# Patient Record
Sex: Female | Born: 1970
Health system: Western US, Academic
[De-identification: ages and names within clinical notes are randomized; demographics above are authoritative.]

---

## 2019-06-13 ENCOUNTER — Ambulatory Visit: Payer: BLUE CROSS/BLUE SHIELD

## 2019-06-13 DIAGNOSIS — Z3009 Encounter for other general counseling and advice on contraception: Secondary | ICD-10-CM

## 2019-06-13 DIAGNOSIS — N819 Female genital prolapse, unspecified: Secondary | ICD-10-CM

## 2019-06-13 DIAGNOSIS — Z0000 Encounter for general adult medical examination without abnormal findings: Secondary | ICD-10-CM

## 2019-06-13 DIAGNOSIS — Z30431 Encounter for routine checking of intrauterine contraceptive device: Secondary | ICD-10-CM

## 2019-06-13 MED ORDER — IBUPROFEN 600 MG PO TABS
600 mg | ORAL_TABLET | Freq: Four times a day (QID) | ORAL | 0 refills | Status: AC | PRN
Start: 2019-06-13 — End: ?

## 2019-06-13 MED ORDER — HYDROCODONE-ACETAMINOPHEN 5-325 MG PO TABS
1 | ORAL_TABLET | Freq: Four times a day (QID) | ORAL | 0 refills | 4.00000 days | Status: AC | PRN
Start: 2019-06-13 — End: ?

## 2019-06-13 NOTE — H&P
GYNECOLOGY OUTPATIENT NOTE    PATIENT:  Sharon Franco  MRN:  [id number]  DOB:  1971-07-19  DATE OF SERVICE:  06/13/2019  PRIMARY CARE PROVIDER: Pcp, No, MD     CC: new patient, annual well woman exam    HPI: 48 y.o. [id number] premenopausal woman    Here for annual exam.    She has several complaints today:    1. Paragard IUD - placed in 2018 after vaginal delivery. Feels like it's contributing to heavier menses and menstrual cramping. Likes the idea of a LARC, but wondering about other options    2. Vaginal bulge - noticed after vaginal delivery in 2018. Not painful, but bothersome. Wondering if she should do Kegel exercises. Denies pelvic floor pain, abnormal bleeding/discharge, dysuria, hematuria, or urinary incontinence    3. Due for routine pap smear, breast exam, and mammogram. It's been several years since she had a well woman exam    No other gynecologic complaints.    Denies fevers, chills, hot flashes, irregular menstrual cycles, vaginal dryness, or GI/urinary symptoms.    PAST MEDICAL HISTORY:  Past Medical History:   Diagnosis Date   ??? Hypothyroidism    ??? IUD (intrauterine device) in place 2018    Paragard IUD inserted 2018       PAST SURGICAL HISTORY:  Past Surgical History:   Procedure Laterality Date   ??? DILATION AND CURETTAGE OF UTERUS      x3   ??? DILATION AND EVACUATION  04/20/2012    Procedure: DILATION AND EVACUATION;  Surgeon: Addison Lank., MD;  Location: [facility];  Service: OB GYN Surgery;  Laterality: N/A;  DILATION AND EVACUATION,PELVIC EXAM UNDER ANESTHESIA, IUD Placement.   ??? SEPTOPLASTY         OB/GYN HISTORY:  OB History   Gravida Para Term Preterm AB Living   6 1 1   5 1    SAB TAB Ectopic Multiple Live Births   1 4     1       # Outcome Date GA Lbr Len/2nd Weight Sex Delivery Anes PTL Lv   6 Term 2018 [redacted]w[redacted]d  7 lb 8 oz (3.402 kg) F Vag-Spont   LIV   5 TAB 2013 [redacted]w[redacted]d    TAB         Birth Comments: Fetal di George syndrome   4 SAB      SAB      3 TAB      TAB         Birth Comments: Fetal T21 2 TAB      TAB      1 TAB      TAB         Obstetric Comments   Normal monthly menses, lasting 5 days   No h/o STIs or PID   No h/o abnormal pap tests   Last pap (06/13/19)   BCM: Paragard IUD inserted 2018 about 2 months postpartum       MEDICATIONS:    Current Outpatient Medications:   ???  copper IUD, 1 each by Intrauterine route., Disp: , Rfl:   ???  thyroid (ARMOUR) 130 mg tablet, Take 35 mg by mouth daily., Disp: , Rfl:      ALLERGIES:  Patient has no known allergies.     FAMILY HISTORY:  Family History   Problem Relation Age of Onset   ??? Anesthesia problems Neg Hx    ??? Malignant hypertension Neg Hx    ??? Hypotension Neg Hx    ???  Malignant hyperthermia Neg Hx    ??? Pseudochol deficiency Neg Hx    ??? Breast cancer Neg Hx    ??? Ovarian cancer Neg Hx    ??? Uterine cancer Neg Hx    ??? Colon cancer Neg Hx        SOCIAL HISTORY:  Social History     Socioeconomic History   ??? Marital status: Single     Spouse name: Not on file   ??? Number of children: Not on file   ??? Years of education: Not on file   ??? Highest education level: Not on file   Occupational History   ??? Not on file   Social Needs   ??? Financial resource strain: Not on file   ??? Food insecurity:     Worry: Not on file     Inability: Not on file   ??? Transportation needs:     Medical: Not on file     Non-medical: Not on file   Tobacco Use   ??? Smoking status: Never Smoker   ??? Smokeless tobacco: Never Used   Substance and Sexual Activity   ??? Alcohol use: No   ??? Drug use: No   ??? Sexual activity: Not on file   Lifestyle   ??? Physical activity:     Days per week: Not on file     Minutes per session: Not on file   ??? Stress: Not on file   Relationships   ??? Social connections:     Talks on phone: Not on file     Gets together: Not on file     Attends religious service: Not on file     Active member of club or organization: Not on file     Attends meetings of clubs or organizations: Not on file     Relationship status: Not on file   Other Topics Concern   ??? Not on file Social History Narrative   ??? Not on file        REVIEW OF SYSTEMS:  14 point review of system negative, unless mentioned in the HPI above.    PHYSICAL EXAM:  VS: BP 121/77 (BP Location: Left arm, Patient Position: Sitting, Cuff Size: Regular)  ~ Pulse 83  ~ Temp 36.5 ???C (97.7 ???F) (Forehead)  ~ Ht 5' 1'' (1.549 m)  ~ Wt 133 lb 3.2 oz (60.4 kg)  ~ BMI 25.17 kg/m???     General Appearance: alert, well appearing and in no distress  Breasts: no masses noted, nontender, no palpable axillary nodes, no skin changes  Abdomen: soft, nontender, nondistended  Pelvic Exam:             Vulva: normal appearing, no lesions or masses            Vagina: anterior vaginal wall prolapse to about 1cm proximal to hymen with valsalva, pink mucosa, normal rugae, no lesions            Cervix: parous, no lesions, non-tender. IUD strings visualized            Uterus: normal size and contour, mobile, non-tender            Adnexae: no palpable masses, non-tender  Extremities: warm and well-perfused, no LE edema or calf erythema/TTP  Skin: normal coloration and turgor, no rashes  Neurological: A&O x3, normal mood and affect    A certified chaperone was present for all sensitive exams.     LABS:    IMAGING:  ASSESSMENT:   48 y.o. presents for:     1. Encounter for screening and preventative care    2. IUD check up    3. Encounter for other general counseling or advice on contraception    4. Female genital prolapse, unspecified type        PLAN:  1. Annual well woman exam  Cervical cancer screening: pap with HPV co-testing done today  STI screening: declined  Breast cancer screening: CBE WNL today, MMG ordered  Colon cancer screening: colonoscopy at age 80  Osteoporosis screening: DXA at age 45  Contraception: Paragard IUD. Strings seen on exam today    2. Contraception counseling  We discussed all options for contraception including barrier methods, Nuvaring, OCPs, transdermal patch, DMPA, IUD, and Nexplanon. We discussed typical side effects/serious adverse effects    Patient considering Paragard IUD removal, and Mirena IUD placement due to unwanted side effects from Paragard (heavier menses, more cramping)    Will return for IUD removal/replacement if she decides to proceed    Per patient, last IUD procedure was very painful. Requested analgesics. Sent Rx ibuprofen and Norco to take as needed after she returns for IUD removal/replacement. She will have her husband drive her to/from the procedure appointment    3. 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 Briefly discussed possible option of pessary or surgery if she fails other treatments      Orders Placed This Encounter   ??? HPV DNA PCR,Genital   ??? Mammogram, screening, bilat breast   ??? Referral to Rehabilitation, Physical Therapy   ??? Pap smear 30+ yo (with HPV), [facility]   ??? Pap Smear   ??? copper IUD   ??? HYDROcodone-acetaminophen 5-325 mg tablet   ??? ibuprofen 600 mg tablet       Return for IUD removal and replacement.     She has our contact information for any questions or concerns.    She asked appropriate questions, these were answered to her satisfaction.    For any testing performed, we will contact her with abnormal results in 1-2 weeks. She was given instructions to call with any questions or concerns sooner or if she does not hear from [place] in the time frame discussed.      Future Appointments   Date Time Provider Department Center   06/29/2019 10:00 AM Hipolito Bayley., MD OBG [facility] [address] [facility] [place]       Adolm Joseph. Cassell Clement, MD

## 2019-06-15 ENCOUNTER — Ambulatory Visit: Payer: BLUE CROSS/BLUE SHIELD

## 2019-06-29 ENCOUNTER — Ambulatory Visit: Payer: BLUE CROSS/BLUE SHIELD

## 2019-06-29 DIAGNOSIS — Z0000 Encounter for general adult medical examination without abnormal findings: Secondary | ICD-10-CM

## 2019-06-29 DIAGNOSIS — Z30433 Encounter for removal and reinsertion of intrauterine contraceptive device: Secondary | ICD-10-CM

## 2019-06-29 MED ADMIN — LEVONORGESTREL 20 MCG/24HR IU IUD: 1 | INTRAUTERINE | @ 18:00:00 | Stop: 2019-06-29 | NDC 50419042301

## 2019-06-30 LAB — Trichomonas vaginalis Antigen: TRICHOMONAS VAGINALIS ANTIGEN: NEGATIVE

## 2019-07-12 NOTE — Progress Notes
GYNECOLOGY OUTPATIENT NOTE    PATIENT:  Rokhaya Quinn  MRN:  [id number]  DOB:  1970/12/25  DATE OF SERVICE:  07/12/2019  PRIMARY CARE PROVIDER: Pcp, No, MD     CC: contraception management, IUD removal and reinsertion    HPI: 48 y.o. [id number] premenopausal woman.     Last seen by me on 06/13/19 for annual well woman exam.     Here today for contraception management. Desires Paragard IUD removal due to unwanted side effects (heavier menses and menstrual cramping). Considering a different LARC, likely Mirena IUD.     Endorses thin white vaginal discharge. Denies fever, chills, abdominal/pelvic pain, vulvar itching/pain, or vaginal malodor.    PAST MEDICAL HISTORY:  Past Medical History:   Diagnosis Date   ? Hypothyroidism    ? IUD (intrauterine device) in place 2018    Paragard IUD inserted 2018       PAST SURGICAL HISTORY:  Past Surgical History:   Procedure Laterality Date   ? DILATION AND CURETTAGE OF UTERUS      x3   ? DILATION AND EVACUATION  04/20/2012    Procedure: DILATION AND EVACUATION;  Surgeon: Addison Lank., MD;  Location: [facility];  Service: OB GYN Surgery;  Laterality: N/A;  DILATION AND EVACUATION,PELVIC EXAM UNDER ANESTHESIA, IUD Placement.   ? SEPTOPLASTY         OB/GYN HISTORY:  OB History   Gravida Para Term Preterm AB Living   6 1 1   5 1    SAB TAB Ectopic Multiple Live Births   1 4     1       # Outcome Date GA Lbr Len/2nd Weight Sex Delivery Anes PTL Lv   6 Term 2018 [redacted]w[redacted]d  7 lb 8 oz (3.402 kg) F Vag-Spont   LIV   5 TAB 2013 [redacted]w[redacted]d    TAB         Birth Comments: Fetal di George syndrome   4 SAB      SAB      3 TAB      TAB         Birth Comments: Fetal T21   2 TAB      TAB      1 TAB      TAB         Obstetric Comments   Normal monthly menses, lasting 5 days   No h/o STIs or PID   No h/o abnormal pap tests   Last pap (06/13/19): NILM, HPV neg   BCM: Paragard IUD inserted 2018        MEDICATIONS:    Current Outpatient Medications:   ?  copper IUD, 1 each by Intrauterine route., Disp: , Rfl: ?  HYDROcodone-acetaminophen 5-325 mg tablet, Take 1 tablet by mouth every six (6) hours as needed for Severe Pain (Pain Scale 7-10). Max Daily Amount: 4 tablets, Disp: 5 tablet, Rfl: 0  ?  ibuprofen 600 mg tablet, Take 1 tablet (600 mg total) by mouth every six (6) hours as needed for Mild Pain (Pain Scale 1-3) or Moderate Pain (Pain Scale 4-6). (Patient not taking: Reported on 06/29/2019.), Disp: 20 tablet, Rfl: 0  ?  thyroid (ARMOUR) 130 mg tablet, Take 35 mg by mouth daily., Disp: , Rfl:      ALLERGIES:  Patient has no known allergies.      REVIEW OF SYSTEMS:   14 point review of system negative, unless mentioned in the HPI above.     PHYSICAL  EXAM:  VS: Ht 5' 1'' (1.549 m)  ~ Wt 135 lb 3.2 oz (61.3 kg)  ~ BMI 25.55 kg/m?     General Appearance: alert, well appearing and in no distress  Pelvic Exam: see full exam from 06/13/19  Extremities: warm and well-perfused, no LE edema or calf erythema/TTP  Skin: normal coloration and turgor, no rashes  Neurological: A&O x3, normal mood and affect    A certified chaperone was present for all sensitive exams.     LABS:    IMAGING:    ASSESSMENT:   48 y.o. presents for:     1. Encounter for removal and reinsertion of intrauterine contraceptive device (IUD)    2. Encounter for screening and preventative care        PLAN:  1. Contraception management - IUD removal/insertion  We discussed all options for contraception, including barrier methods, Nuvaring, OCPs, transdermal patch, DMPA, IUD (LNG or copper), and Nexplanon.  We discussed typical side effects/serious adverse effects from all of the above methods.    Patient desires Paragard IUD removal today due to unwanted side effects (heavy menses, cramping)    She would like to proceed with Mirena IUD insertion. We discussed the main side effects of 80% chance lighter periods, irregular spotting for the first 6-8 months, 20% chance of amenorrhea. We also discussed the small chance of acne and ovarian cyst formation. These are usually transient. The intrauterine device does not protect against STIs and does not elevate the risk of PID beyond any intrauterine procedure. Beyond side effects noted above, we discussed small risk of perforation, infection, expulsion. We discussed possible discomfort during placement in detail and she verbalized understanding of all this. She asked appropriate questions, these were answered to her satisfaction. She desired to proceed    IUD Removal and Insertion Procedure Note  Risks/benefits/alternatives reviewed. After informed consent was obtained the patient was placed in the lithotomy position. Bimanual exam was performed and this revealed a uterus was 8 weeks and anteverted. Cervix was visualized with a speculum and a specimen was collected for GC/CT/wet mount testing. The IUD strings were then grasped with ring forceps and the Paragard IUD was removed intact without difficulty. Next, the cervix was cleansed with betadine and a tenaculum was placed on the anterior lip of the cervix. The uterus was sounded to 7.5 cm. Mirena device placed in one pass. Strings cut to 2 cm. Tenaculum was removed. Hemostasis was assured. The patient tolerated procedure well. She'll follow-up in 3 months sooner if any questions/concerns. She has our contact information.    A certified chaperone was present for the entire procedure.     2. Pelvic organ prolapse  Discuss exam findings on 06/13/19, appears to be Stage II POP at this time  Reviewed that this is a benign condition  Continue Kegel exercises  If no improvement with Kegel's, then will try pelvic floor physical therapy  Will refer to [facility] if no improvement with Kegels/PFPT. Briefly discussed possible option of pessary or surgery if she fails other treatments    3. Healthcare maintenance   Cervical cancer screening: pap UTD, next due 06/2024  STI screening: GC/CT collected today. Declined further testing Breast cancer screening: CBE WNL 06/13/19, MMG scheduled 09/17/19  Colon cancer screening: colonoscopy at age 60  Osteoporosis screening: DXA at age 73  Contraception: Mirena IUD as above    Orders Placed This Encounter   ? Bacterial Vaginosis Screen   ? Trichomonas vaginalis Antigen, Genital Swab   ?  Fungal Stain, Genital Swab   ? Chlamydia trachomatis/Neisseria gonorrhoeae PCR, Genital   ? levonorgestrel (Mirena) 20 mcg/24 hr IUD 1 intra uterine device     Return in about 3 months (around 09/29/2019) for IUD check. Sooner as needed.     She has our contact information for any questions or concerns.    She asked appropriate questions, these were answered to her satisfaction.    For any testing performed, we will contact her with abnormal results in 1-2 weeks. She was given instructions to call with any questions or concerns sooner or if she does not hear from [place] in the time frame discussed.    More than 50% of the visit was spent in counseling and coordination of care. Total encounter time: 15 minutes.    Future Appointments   Date Time Provider Department Center   09/17/2019  8:30 AM [facility] 01-RADIOLOGY [facility]       Adolm Joseph. Cassell Clement, MD

## 2019-09-17 ENCOUNTER — Ambulatory Visit: Payer: BLUE CROSS/BLUE SHIELD

## 2019-10-17 ENCOUNTER — Telehealth: Payer: BLUE CROSS/BLUE SHIELD

## 2019-10-17 NOTE — Telephone Encounter
Will fax the prescription to number below.

## 2019-10-17 NOTE — Telephone Encounter
Orders Request    What is being requested? (Tests, Labs, Imaging, etc.): Mammogram- Imaging     Reason for the request: The patient moved to [facility] and would like to complete her mammogram at  [facility]     Where does the patient want to be seen?  [facility]      If outside [place], what is the fax number to the facility?  [phone]    Has the patient seen their doctor for this matter? Yes     Last office visit: 06/13/19    Patient was offered an appointment but declined.    Patient has been notified of the 24-48 hour turnaround time.

## 2019-10-18 NOTE — Telephone Encounter
Called pt and left her know.

## 2019-12-09 ENCOUNTER — Ambulatory Visit: Payer: BLUE CROSS/BLUE SHIELD

## 2019-12-09 DIAGNOSIS — Z23 Encounter for immunization: Secondary | ICD-10-CM

## 2021-04-03 IMAGING — MG MAMMOGRAPHY SCREENING BILATERAL 3[PERSON_NAME]
8 series · 8 of 24 positions shown · non-contrast
Comparison: None

ADDENDED Diagnostic Imaging Report 
________________________________________________________________________________________________ 
******** ADDENDUM #1 ********/n 
Prior exams are now available for direct comparison. 
There is increased density identified directly deep to the areola all the way 
along the chest wall. Not seen on the comparison exams. Recommend additional 
imaging with possible sonography. 
BI-RADS 0. 
MAMMOGRAPHY SCREENING BILATERAL 3MAXX MARCONI, 04/03/2021 [DATE]: 
CLINICAL INDICATION: Screening.
TECHNIQUE: Digital bilateral mammograms and 3-D Tomosynthesis were obtained. 
These were interpreted both primarily and with the aid of computer-aided 
detection system.  
BREAST DENSITY: (Level D) The breasts are extremely dense, which lowers the 
sensitivity of mammography.

[L MLO]
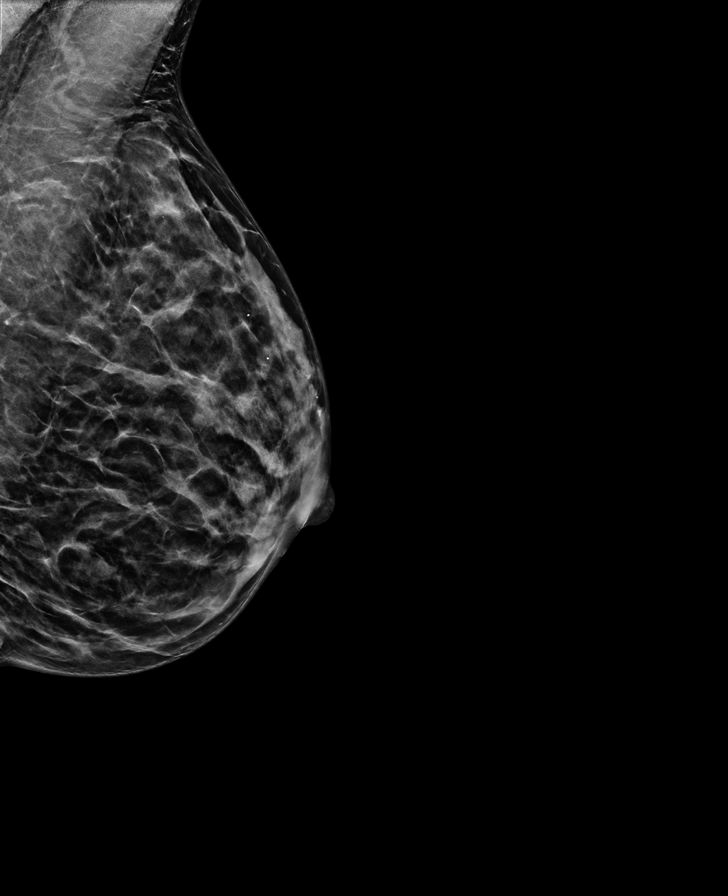

[R CC]
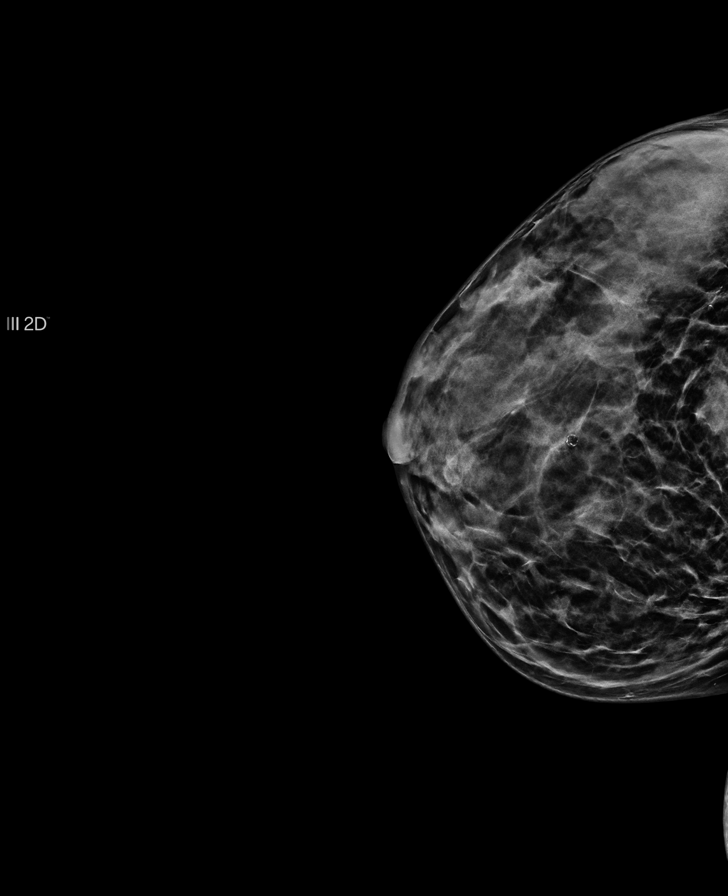

[L CC]
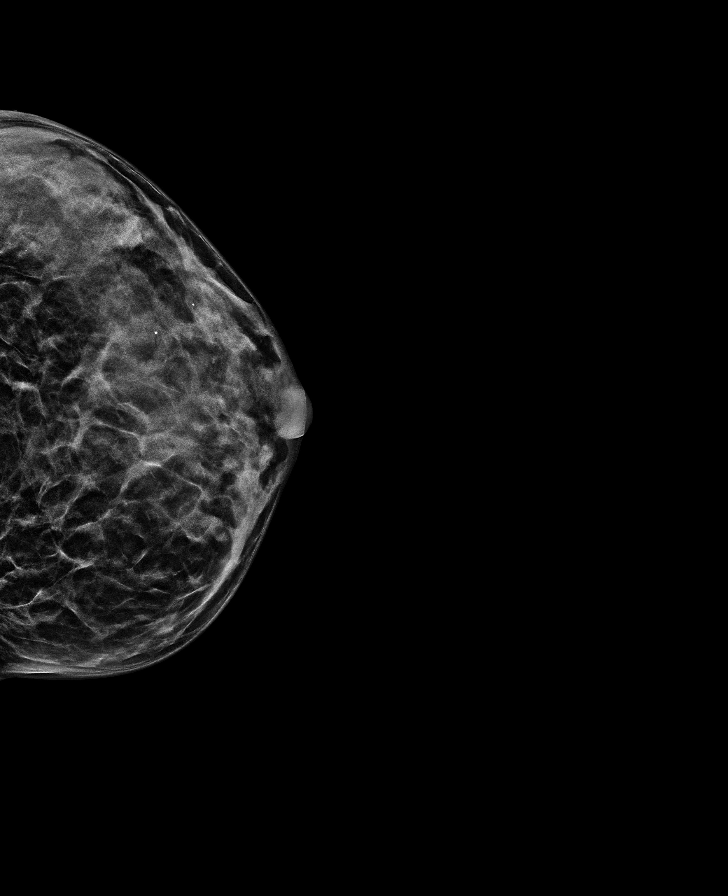

[R MLO]
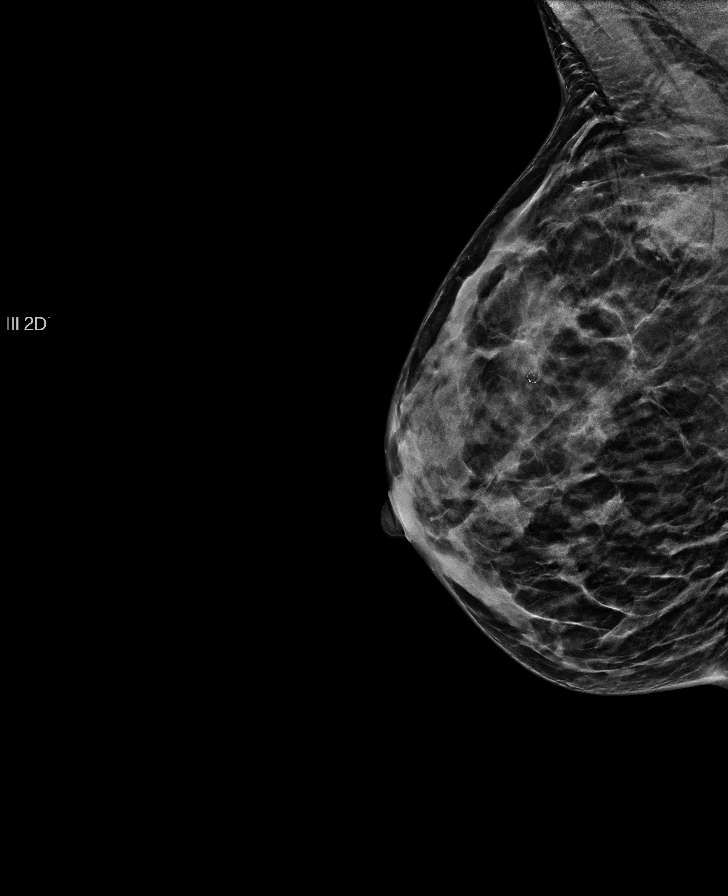

[R CC tomo · tomo slice 21/40.0]
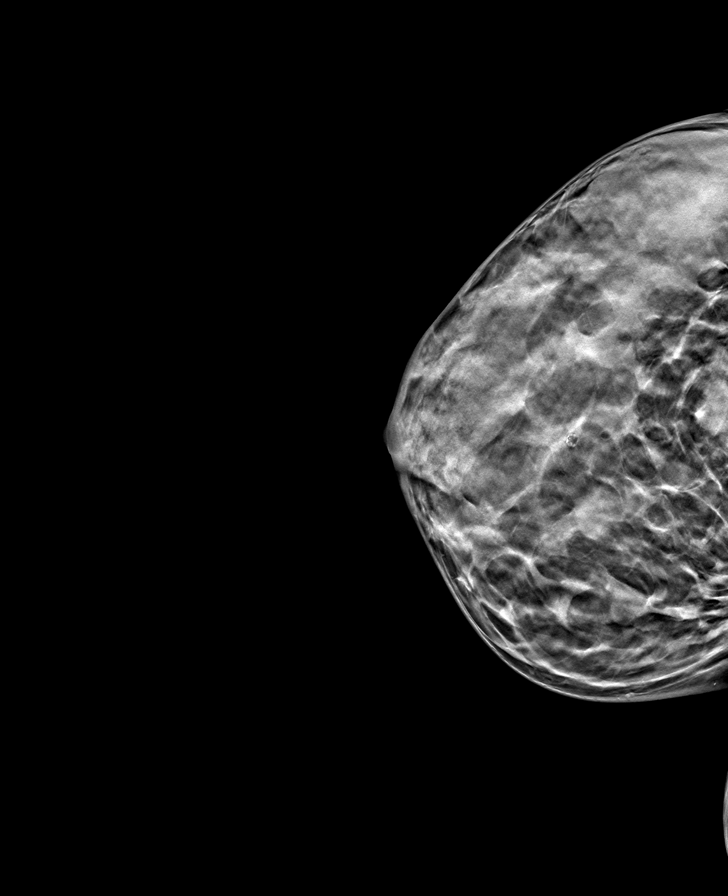

[L MLO tomo · tomo slice 20/39.0]
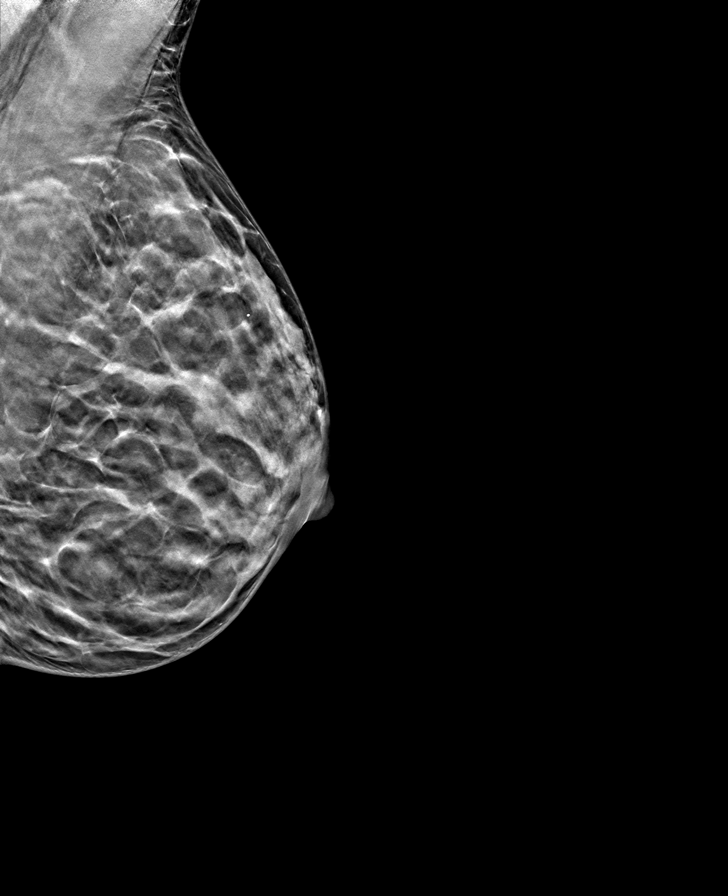

[R MLO tomo · tomo slice 19/38.0]
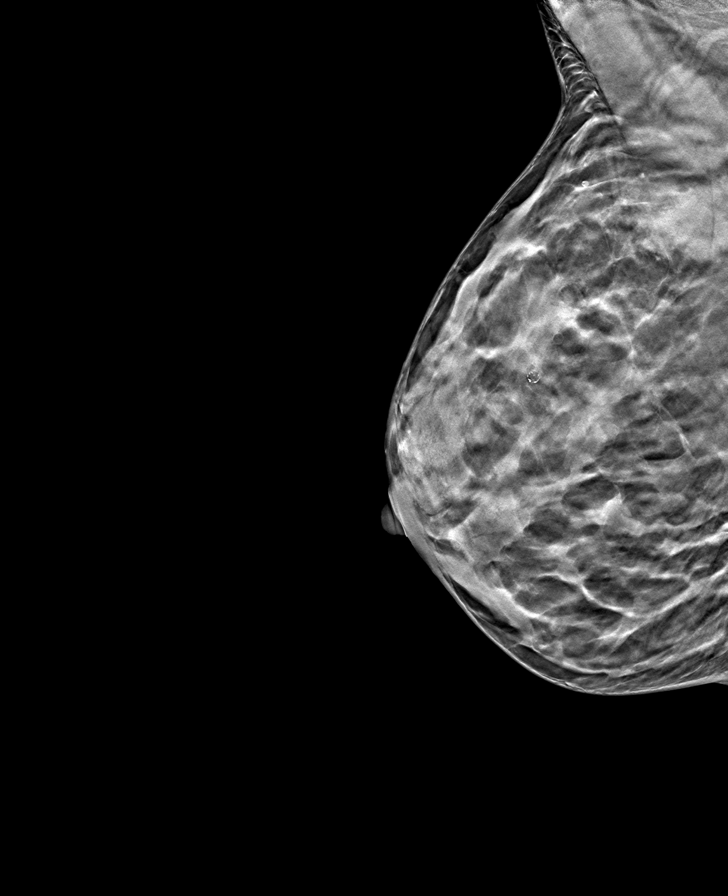

[L CC tomo · tomo slice 21/40.0]
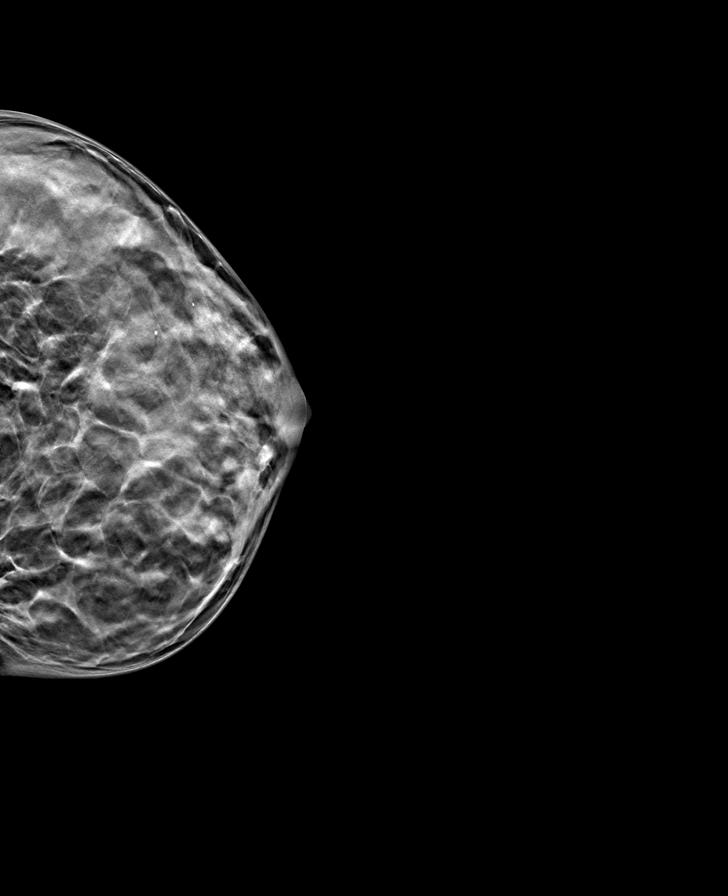

[8 of 24 positions shown; findings below may reference images not displayed]

FINDINGS: Dense glandular tissue. Few scattered benign calcifications. Increased 
density seen deep to the areola within the right cc view back along the chest 
wall. May represent glandular tissue seen superiorly on the MLO view. Will 
attempt to obtain the patients prior films for direct comparison.
IMPRESSION: Comparison exam with priors recommended. 
(BI-RADS 0) Incomplete. Further evaluation will be performed as discussed above 
and the results will be reported separately.

## 2021-04-26 IMAGING — MG MAMMOGRAPHY DIAGNOSTIC RIGHT 3D TOMOSYNT
6 series · 6 of 18 positions shown · non-contrast
Comparison: 04/03/2021 and dating back to 05/10/2014.

________________________________________________________________________________________________ 
MAMMOGRAPHY DIAGNOSTIC RIGHT 3D TOMOSYNT, 04/26/2021 [DATE]: 
CLINICAL INDICATION: Callback exam.
TECHNIQUE: Unilateral right breast digital mammography and 3-D Tomosynthesis 
were obtained. In addition, computer-aided detection was utilized. 
BREAST DENSITY: (Level D) The breasts are extremely dense, which lowers the 
sensitivity of mammography.

[R ML]
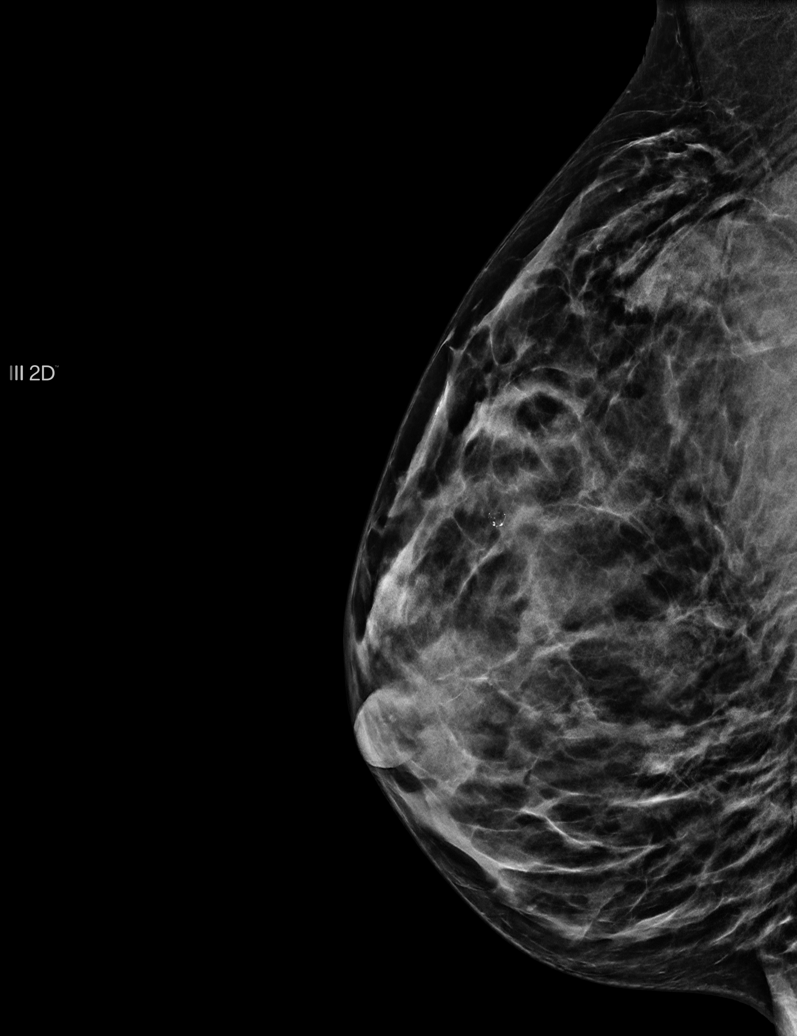

[R CC (1 of 2)]
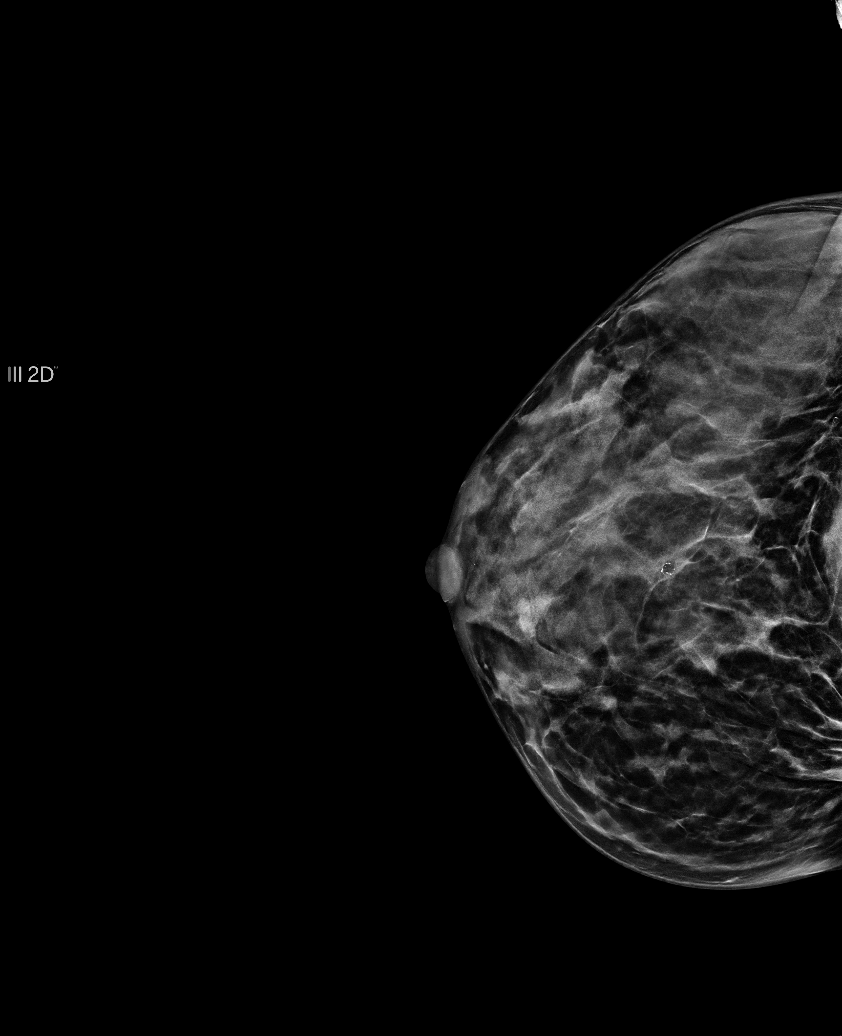

[R CC (2 of 2)]
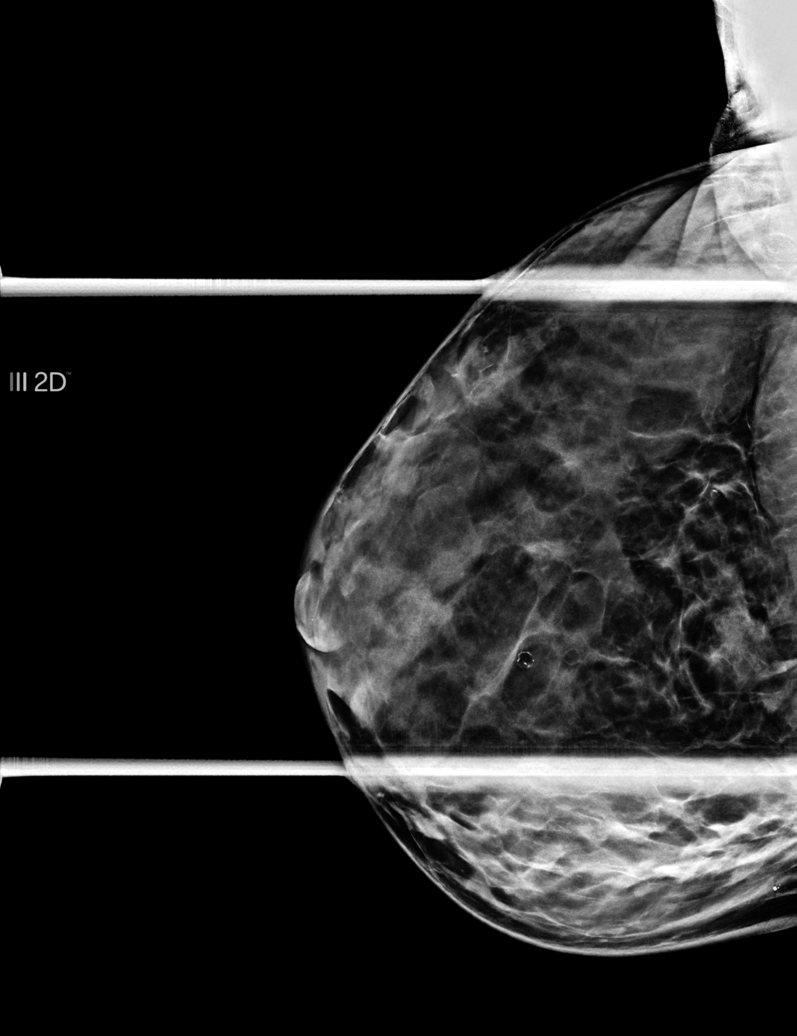

[R CC tomo (1 of 2) · tomo slice 23/44.0]
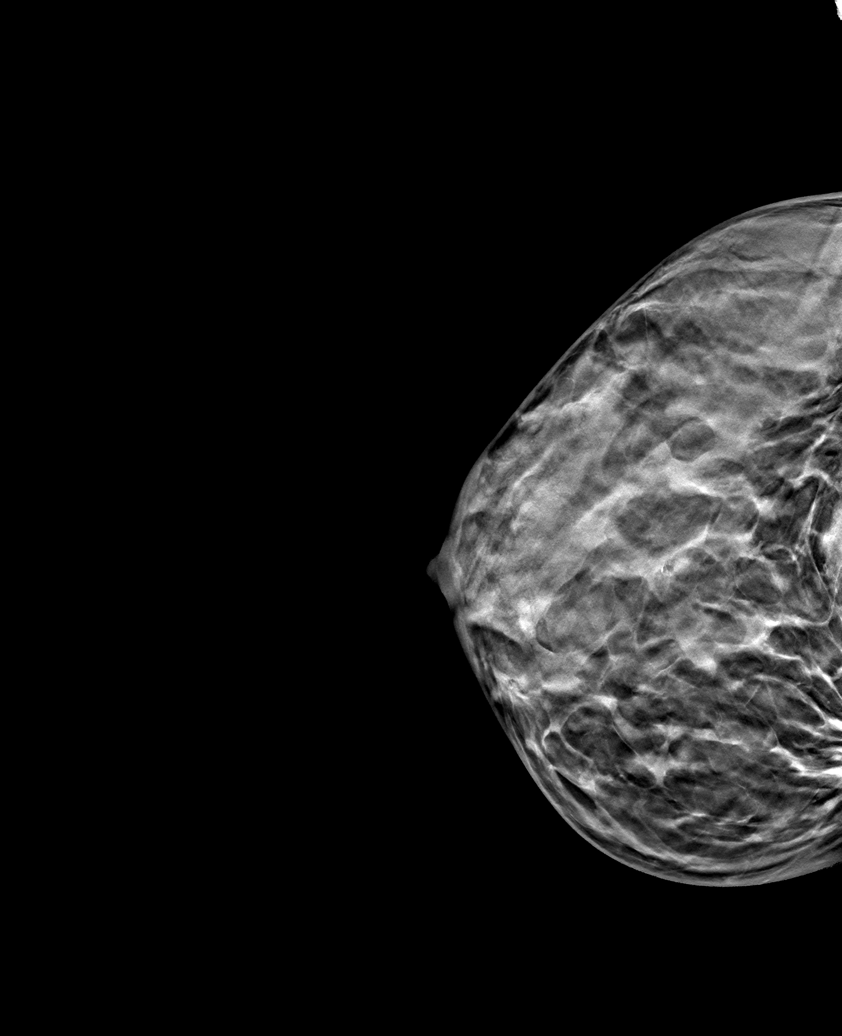

[R ML tomo · tomo slice 22/43.0]
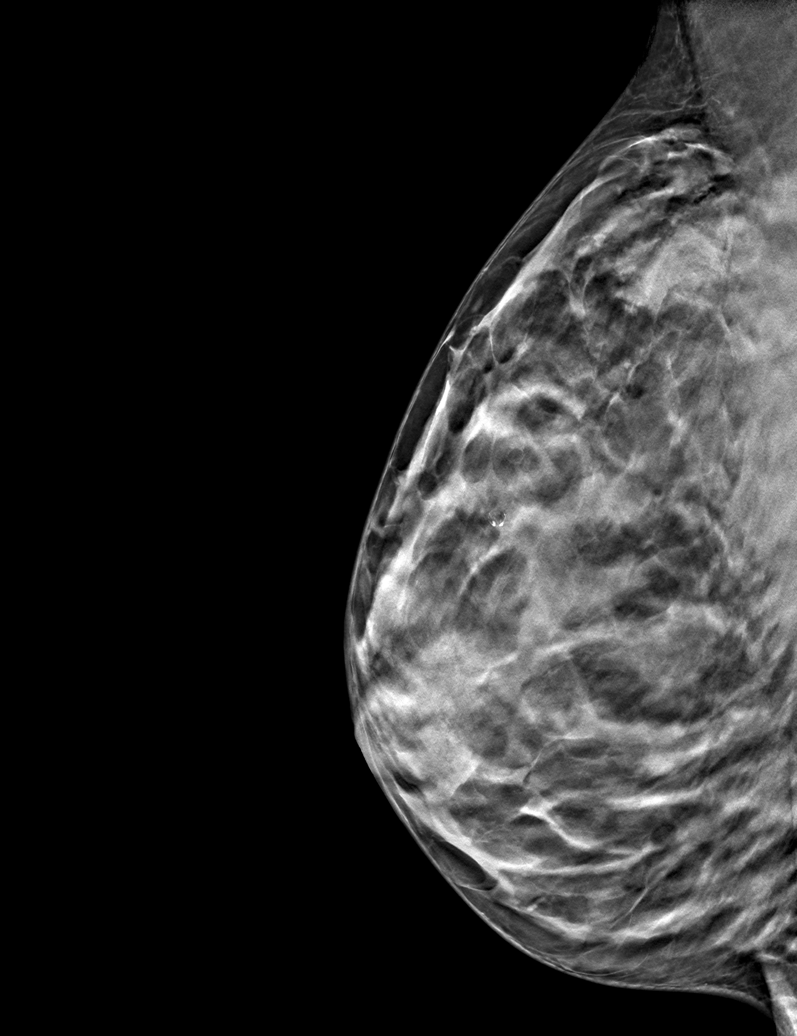

[R CC tomo (2 of 2) · tomo slice 25/48.0]
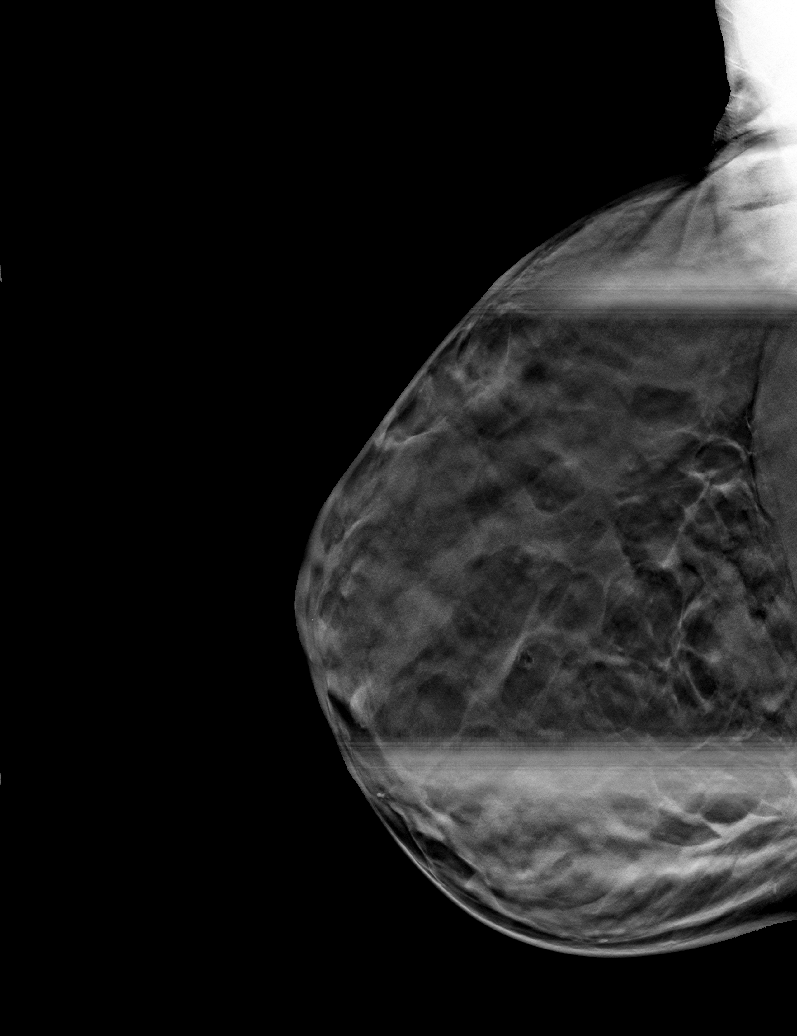

[6 of 18 positions shown; findings below may reference images not displayed]

FINDINGS: The area in question compresses as normal fibroglandular parenchyma 
on todays exam. No suspicious mass, calcifications, or area of architectural 
distortion in the right breast.
IMPRESSION: Overall stable mammographic appearance of the right breast. 
(BI-RADS 2) Benign findings. Routine mammographic follow-up is recommended.

## 2021-05-31 IMAGING — MR MRI BREAST BILATERAL W/WO CONTRAST
4 of 12 series · 13 of 48 positions shown · IV contrast (gadolinium)
Comparison: Mammogram of 04/26/2021 and dating back to 05/10/2014.

________________________________________________________________________________________________ 
MRI BREAST BILATERAL W/WO CONTRAST, 05/31/2021 [DATE]: 
CLINICAL INDICATION: Diffuse cystic mastopathy. Dense breasts. Patient feels 
lumps. Breast swelling. Dimpling.
TECHNIQUE: Multiple sequences were obtained in various planes with both before 
and after the intravenous administration of gadolinium. In addition to the 
routine images, three-dimensional renderings were performed on an independent 
workstation, time activity curves generated over areas of enhancement and 
computer-aided detection utilized. 7.5 mL of Gadavist were injected 
intravenously with the injector at a rate of 1.5 cc per second. Patient was 
scanned on a 3 to magnet.

[Series 201: survey · axial · 7.0mm · 1.34mm/px · z∈[-1,+257]mm · 2 of 25 slices shown]
[im 1/25]
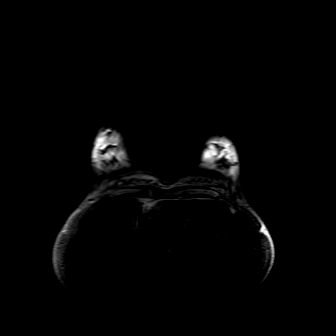
[im 25/25]
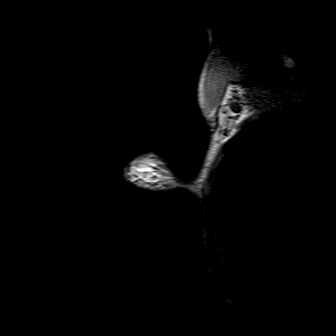

[Series 301: t1w_ffe_3d · axial · 1.6mm · 0.43mm/px · z∈[-21,+178]mm · 4 of 205 slices shown]
[im 1/205]
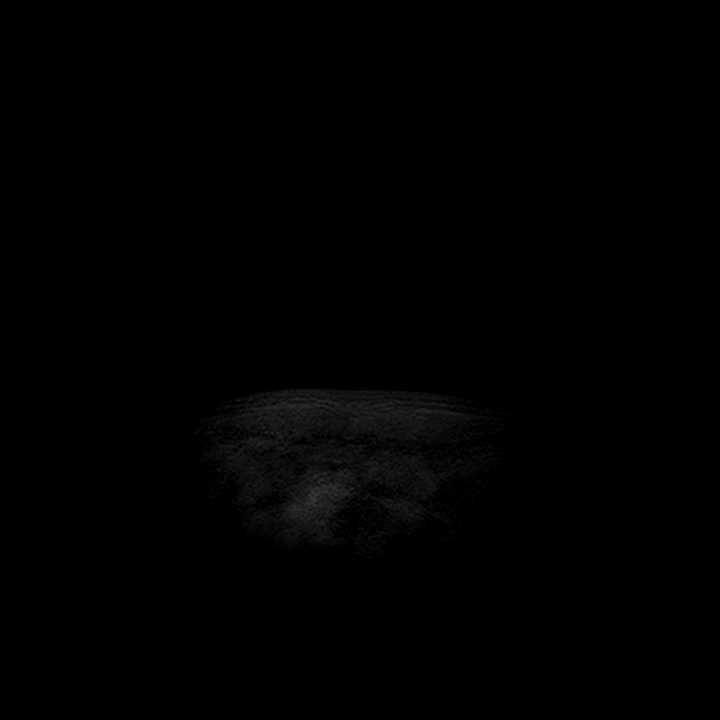
[im 69/205]
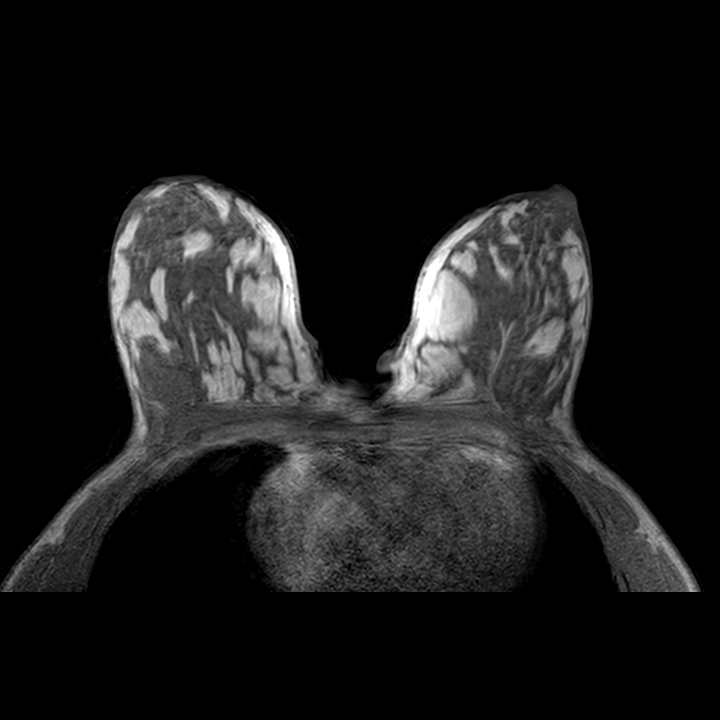
[im 137/205]
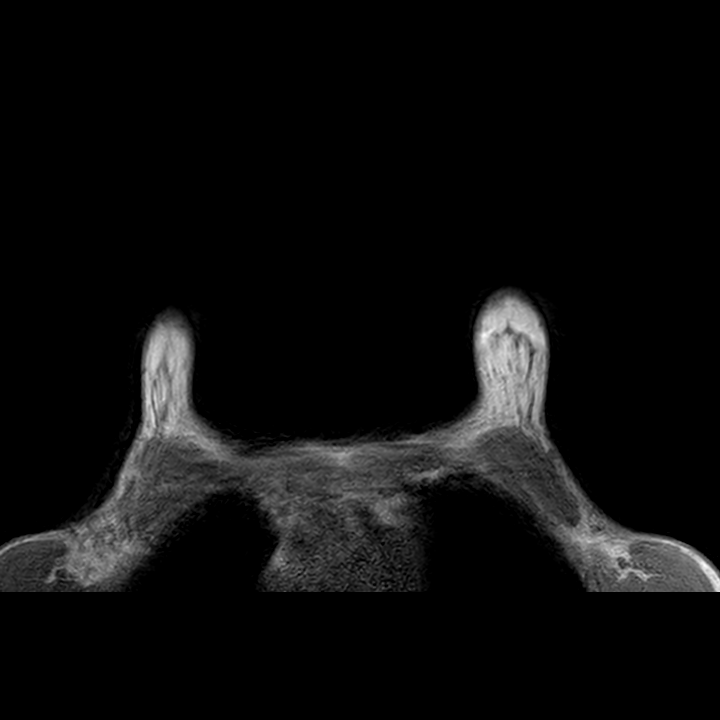
[im 205/205]
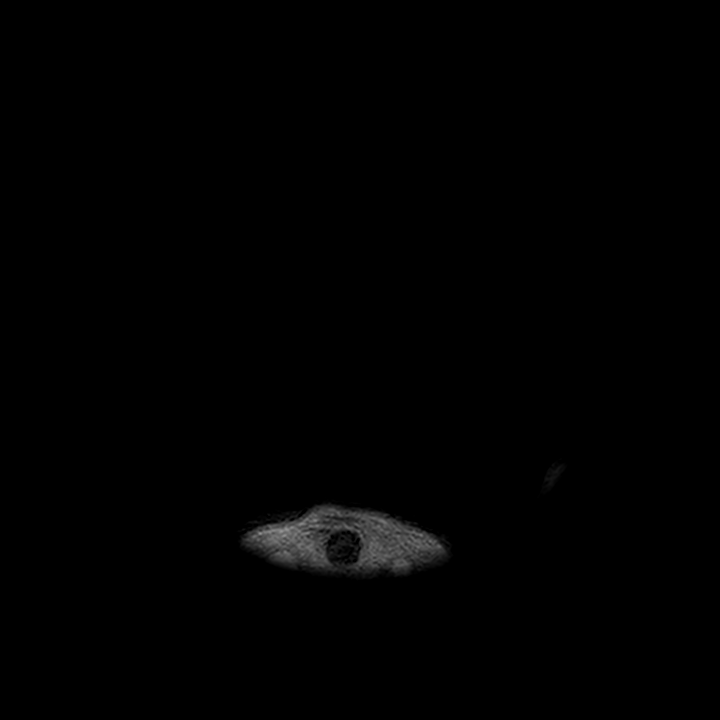

[Series 401: t2w_(id) · axial · 2.5mm · 0.49mm/px · z∈[-20,+177]mm · 2 of 80 slices shown]
[im 1/80]
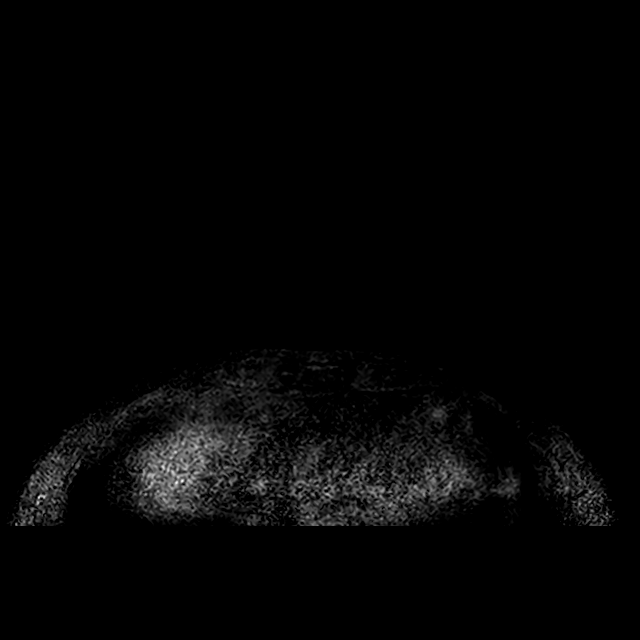
[im 80/80]
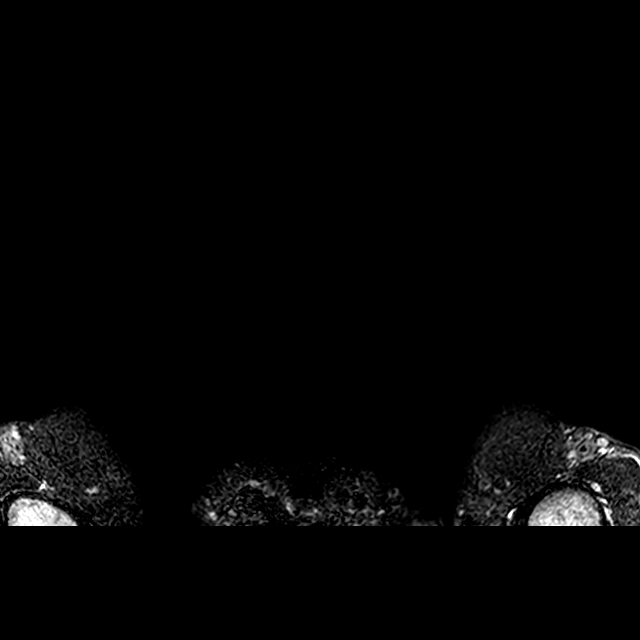

[Series 502: ssub dyn 1 · axial · 1.6mm · 0.46mm/px · z∈[-21,+178]mm · 5 of 250 slices shown]
[im 1/250]
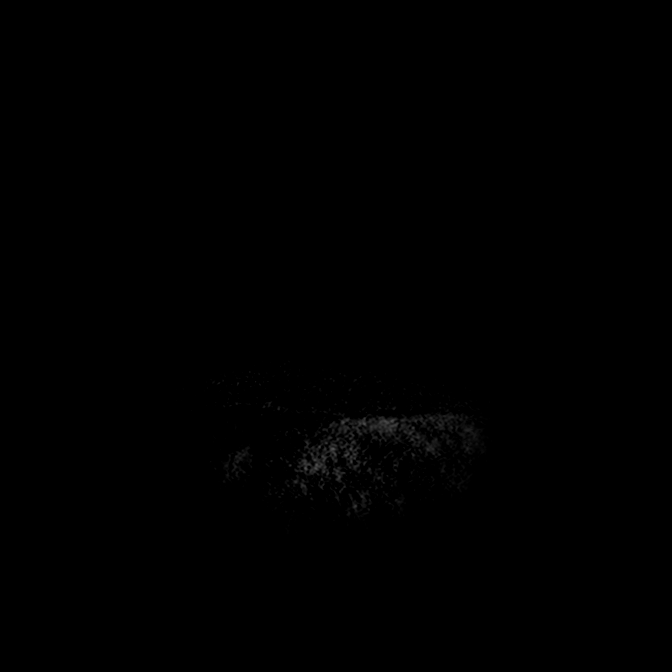
[im 63/250]
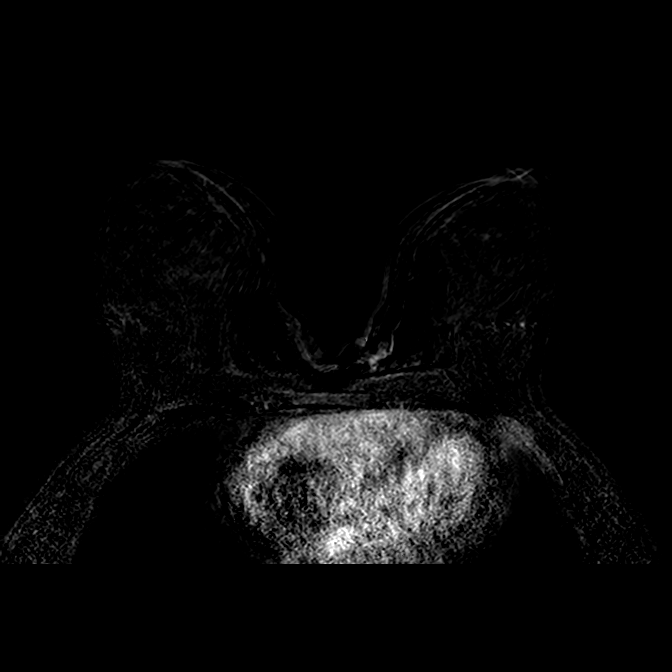
[im 125/250]
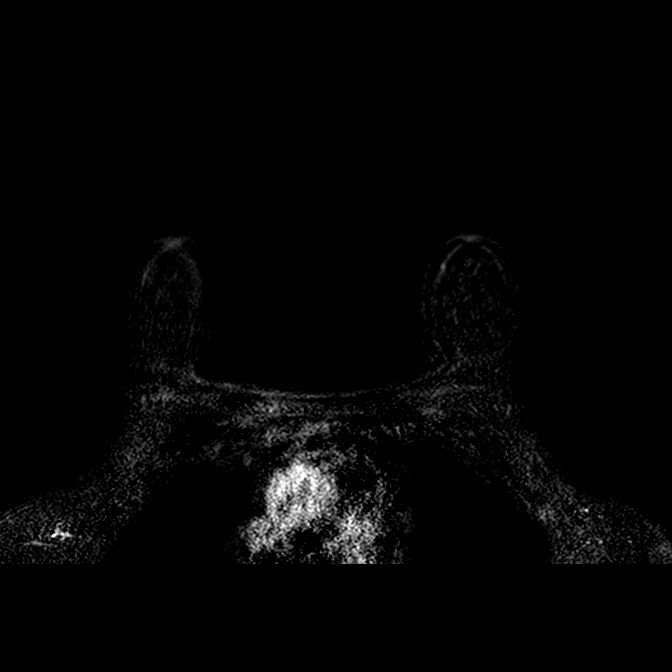
[im 187/250]
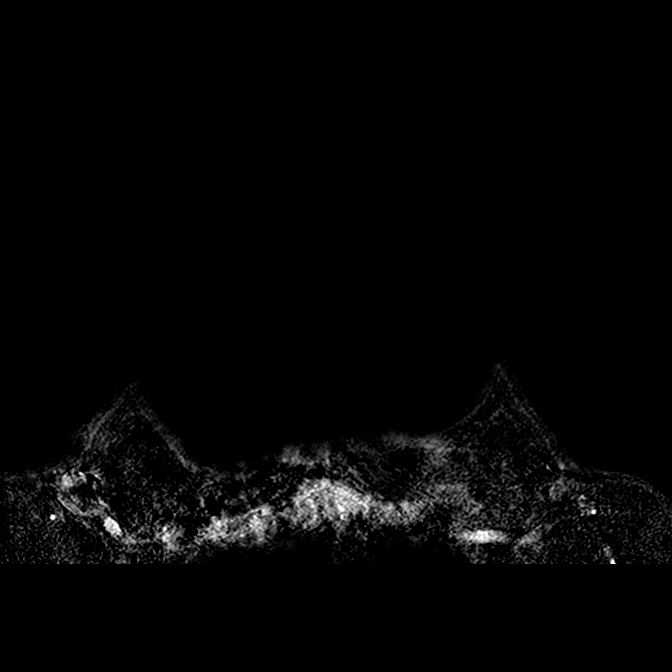
[im 250/250]
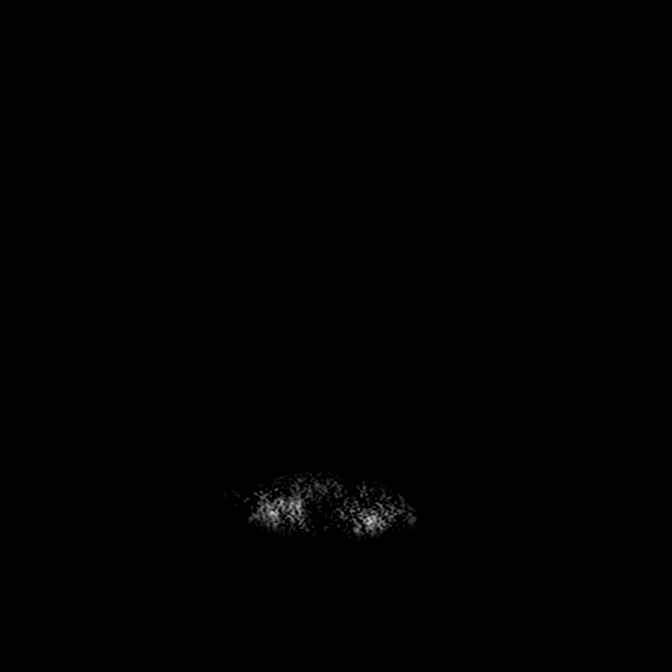

[13 of 48 positions shown; findings below may reference images not displayed]

FINDINGS: Minimal background parenchymal enhancement. Extremely dense breast 
parenchyma. 
Right breast: No abnormal areas of enhancement. No areas of enhancement meeting 
threshold criteria on CAD analysis. Small nonspecific right axillary lymph 
nodes.  No internal mammary nodes. 
Left breast: No abnormal areas of enhancement. No areas of enhancement meeting 
threshold criteria on CAD analysis. Small nonspecific left axillary lymph nodes. 
 No internal mammary nodes.
IMPRESSION: No MR findings suggestive for malignancy. 
(BI-RADS 2) Benign findings. Routine mammographic follow-up is recommended.

## 2021-10-25 IMAGING — MR MRI LEFT KNEE WITHOUT CONTRAST
4 of 5 series · 22 of 40 positions shown · IV contrast (gadolinium)
Comparison: None

________________________________________________________________________________________________ 
MRI LEFT KNEE WITHOUT CONTRAST, 10/25/2021 [DATE]: 
CLINICAL INDICATION: Left knee pain.
TECHNIQUE: Multiplanar, multiecho position MR images of the knee were performed 
without intravenous gadolinium enhancement. Patient was scanned on a
magnet.

[Series 201: survey_ · axial · 10.0mm · 1.17mm/px · z∈[-20,+149]mm · 3 of 9 slices shown]
[im 1/9]
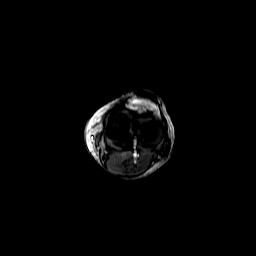
[im 5/9]
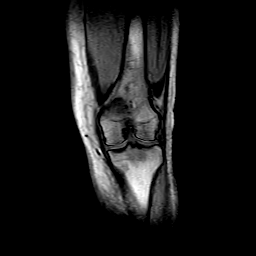
[im 9/9]
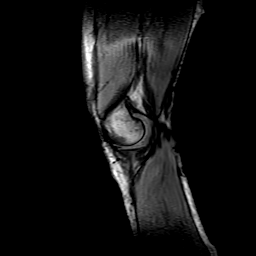

[Series 301: (person_name)_(person_name)_(person_name) · axial · 3.5mm · 0.37mm/px · z∈[-103,+40]mm · 8 of 36 slices shown]
[im 1/36]
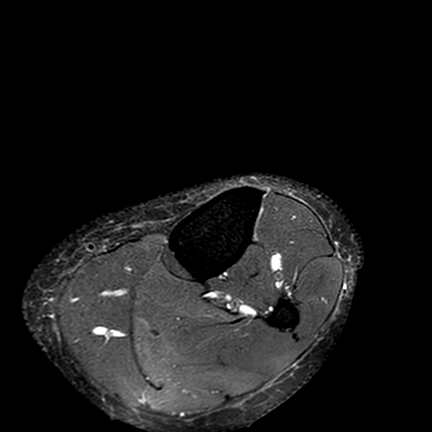
[im 4/36]
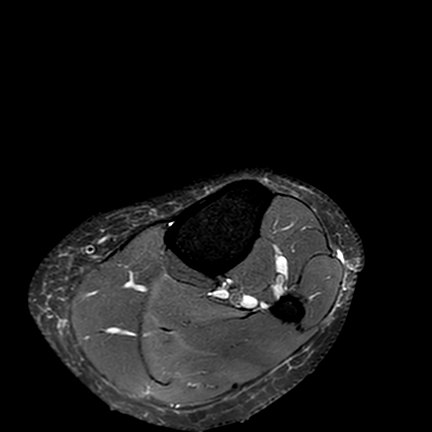
[im 12/36]
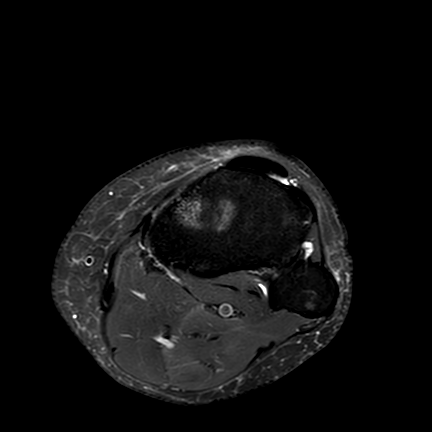
[im 16/36]
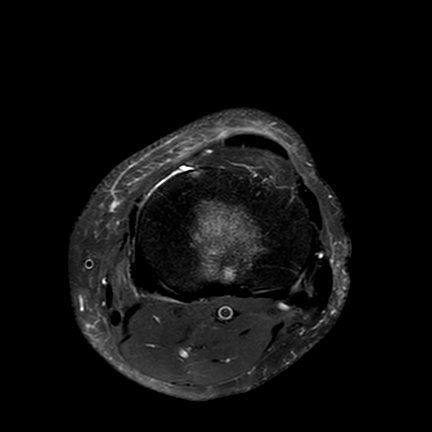
[im 20/36]
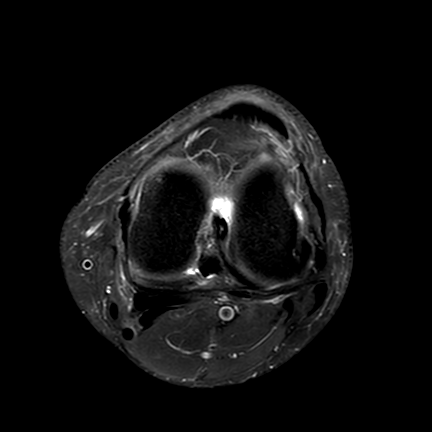
[im 24/36]
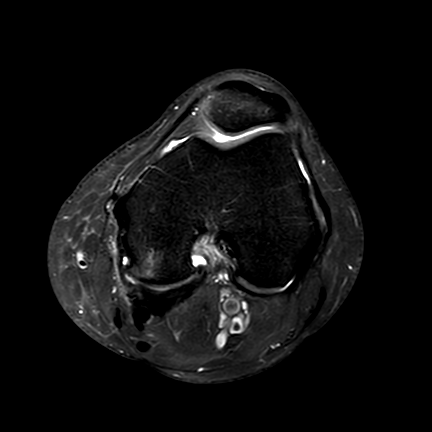
[im 32/36]
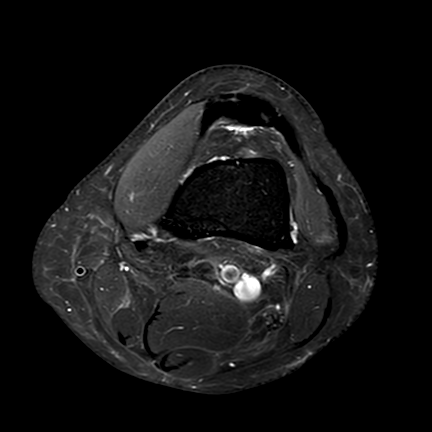
[im 36/36]
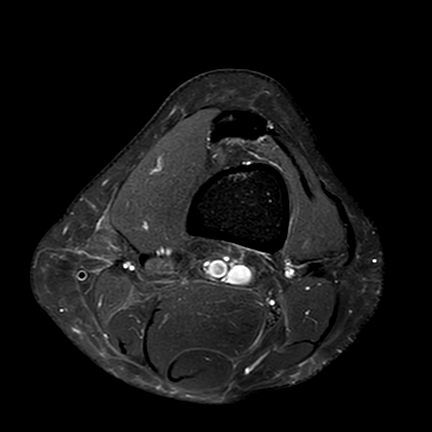

[Series 401: pd_fs_sag fh · sagittal · 3.0mm · 0.31mm/px · 8 of 32 slices shown]
[im 1/32]
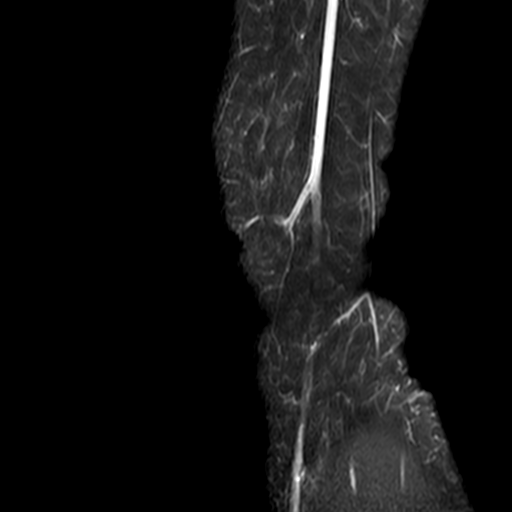
[im 4/32]
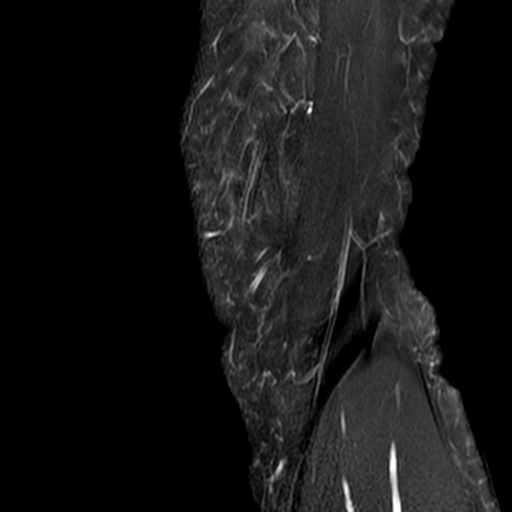
[im 8/32]
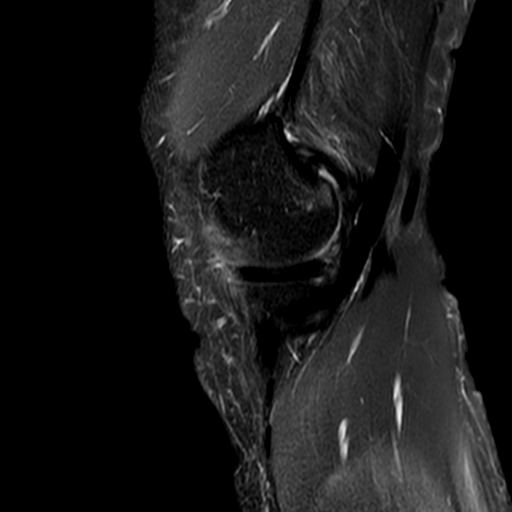
[im 12/32]
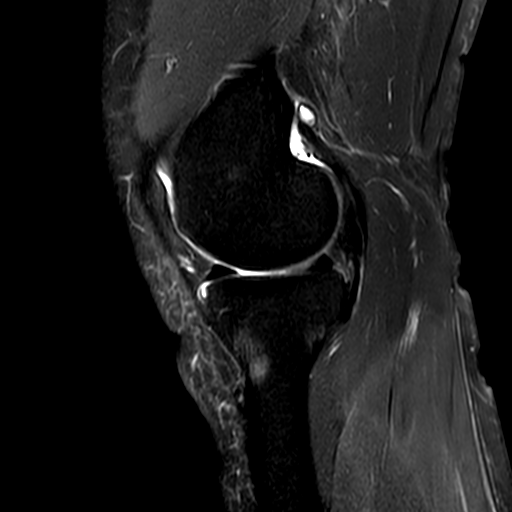
[im 16/32]
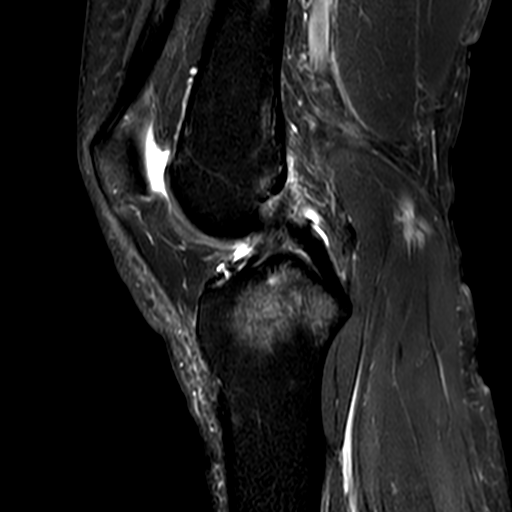
[im 20/32]
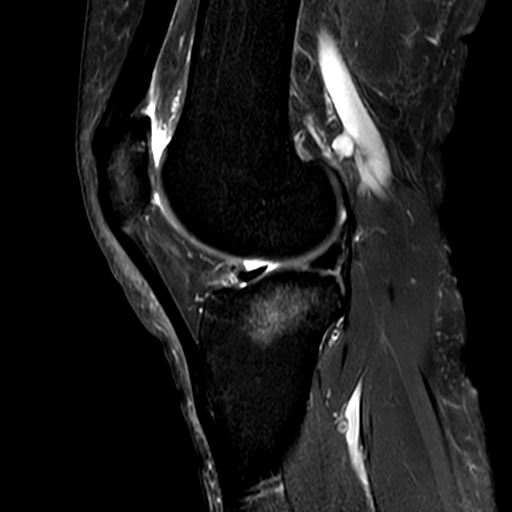
[im 24/32]
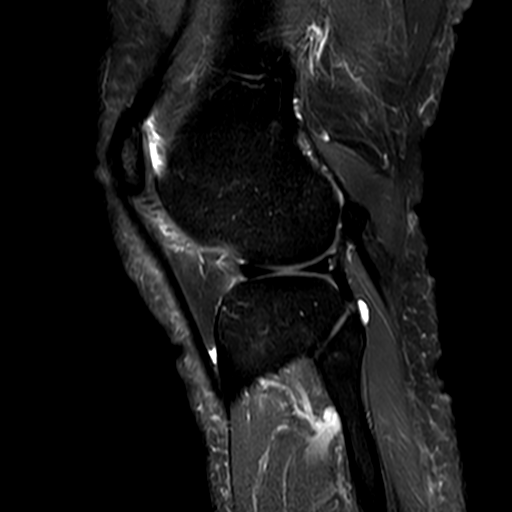
[im 28/32]
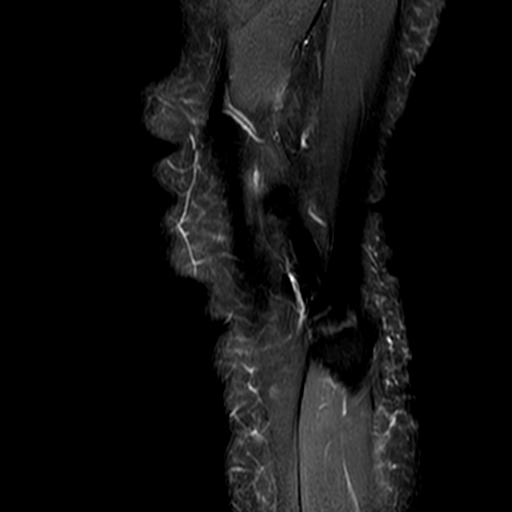

[Series 501: pd_fs_cor · coronal · 3.3mm · 0.31mm/px · 3 of 32 slices shown]
[im 4/32]
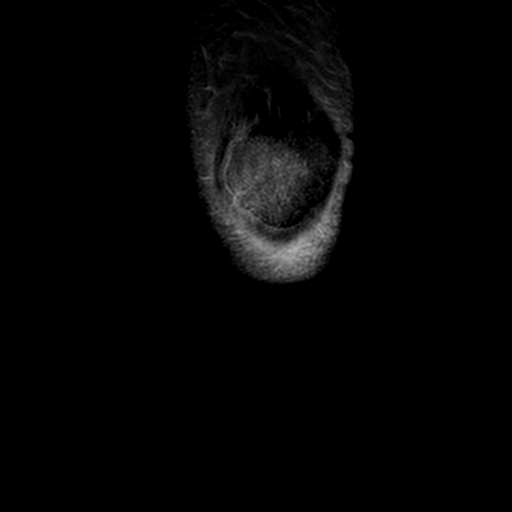
[im 16/32]
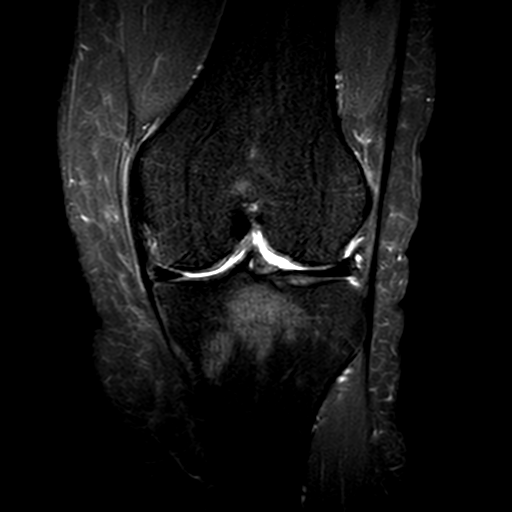
[im 28/32]
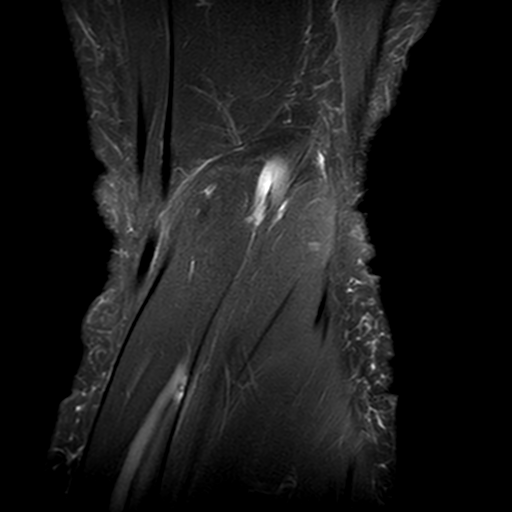

[22 of 40 positions shown; findings below may reference images not displayed]

FINDINGS: MEDIAL COMPARTMENT: Horizontal tear of the body and posterior horn of the medial 
meniscus. No medial meniscal extrusion. Up to grade II chondromalacia. 
LATERAL COMPARTMENT: The lateral meniscus is intact without tear or extrusion. 
Articular cartilage is preserved. 
PATELLOFEMORAL COMPARTMENT: The patella is centrally located. Mild patella alta 
(Insall-Salvati ratio = 1.4). Up to grade III chondromalacia of the patellar 
apex and grade II chondromalacia of the remaining joint.  
TIBIOFIBULAR COMPARTMENT: Negative. 
LIGAMENTS: The anterior cruciate ligament is intact. The posterior cruciate 
ligament is intact. The medial collateral ligament and lateral collateral 
ligaments are preserved. 
EXTENSOR MECHANISM: The quadriceps and patellar tendon are preserved. The medial 
and lateral retinacula are intact. 
POSTEROMEDIAL CORNER: The semimembranosus and pes anserine tendons are 
preserved. The posterior oblique ligament and posterior medial joint capsule are 
intact. 
POSTEROLATERAL CORNER: The popliteal tendon and popliteofibular ligament are 
intact. The biceps femoris is negative. 
BONES: No fracture. Subcortical cystic change in the posterior central tibial 
plateau measures up to 7 mm. Marrow edema-like signal changes in the central 
tibial plateau measures up to 3.3 cm. Additional small foci of marrow edema in 
the medial proximal tibia and medial femoral condyle. Medial femoral condylar 
bone island. 
ADDITIONAL FINDINGS: No knee joint effusion. No popliteal cyst. Mild soft tissue 
swelling in the superolateral Andika Budi fat pad. The musculature is normal without 
mass, signal abnormality or atrophy. Neurovascular bundles are negative. Mild 
soft tissue swelling.
IMPRESSION: 1.  Horizontal tear of the body and posterior horn of the medial meniscus.  
2.  Moderate patellar and mild medial compartment chondromalacia, and central 
tibial plateau degenerative subcortical cystic change and marrow edema.  
3.  Mild patella alta and mild superolateral Andika Budi fat pad soft tissue swelling.

## 2023-08-04 IMAGING — MG MAMMOGRAPHY DIAGNOSTIC BILATERAL 3[PERSON_NAME]
8 series · 9 of 24 positions shown · non-contrast
Comparison: Comparison was made to prior examinations.

________________________________________________________________________________________________ 
MAMMOGRAPHY DIAGNOSTIC BILATERAL [id number], BREAST ULTRASOUND BILATERAL LIMITED, 
08/04/2023 [DATE]: 
CLINICAL INDICATION: Bilateral intermittent breast pain with nipple inversion.
TECHNIQUE: Digital bilateral mammograms and 3-D Tomosynthesis were obtained. 
These were interpreted both primarily and with the aid of computer-aided 
detection system. Limited bilateral breast ultrasound also performed. 
BREAST DENSITY: (Level D) The breasts are extremely dense, which lowers the 
sensitivity of mammography.

[R MLO]
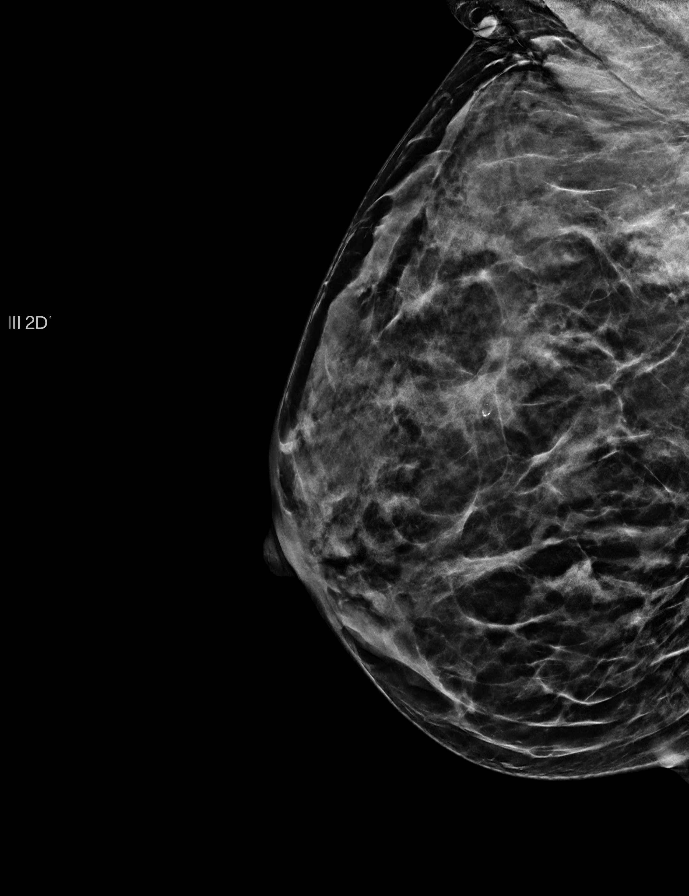

[L MLO]
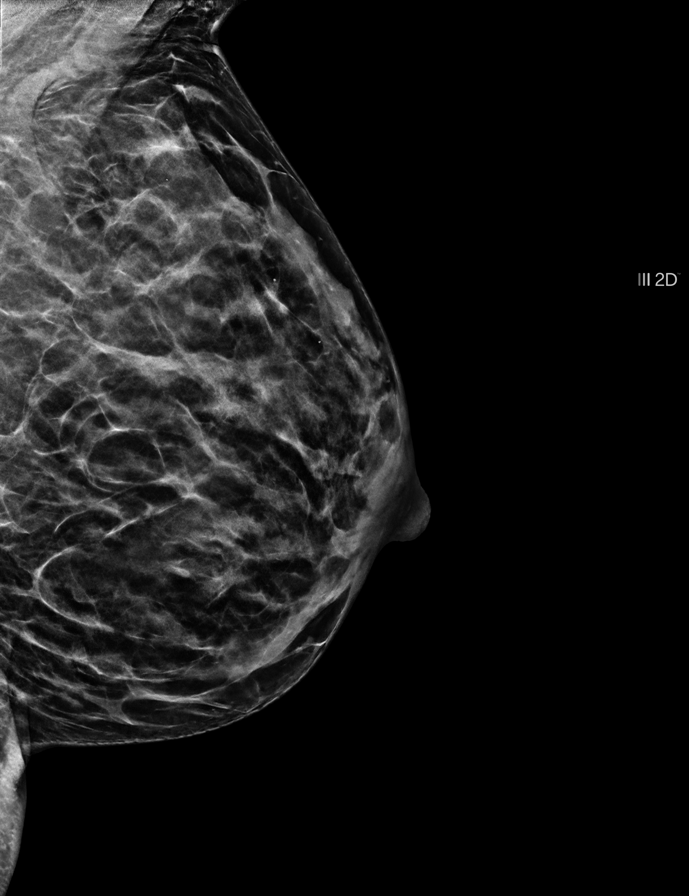

[L CC]
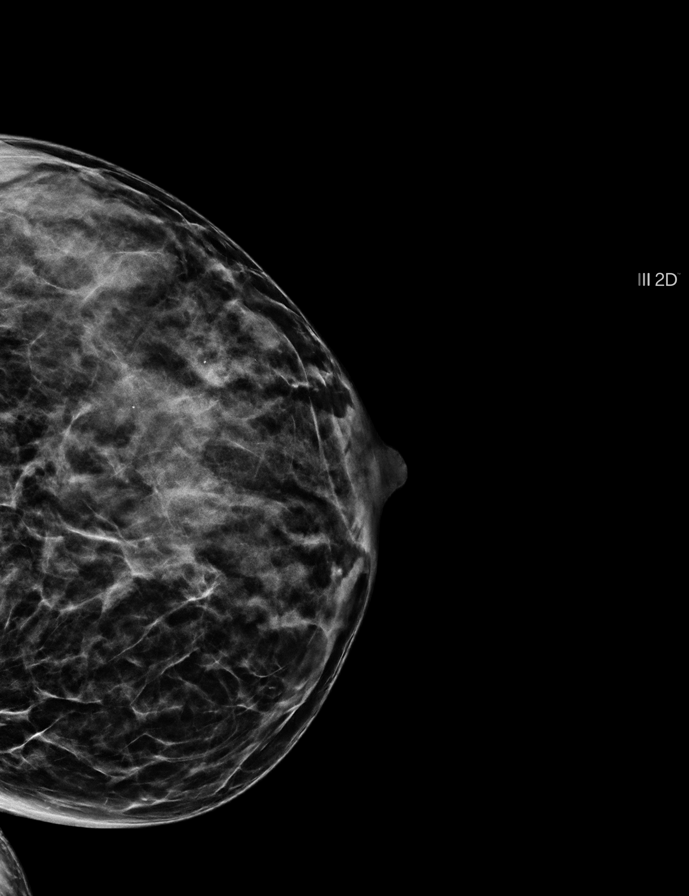

[R CC]
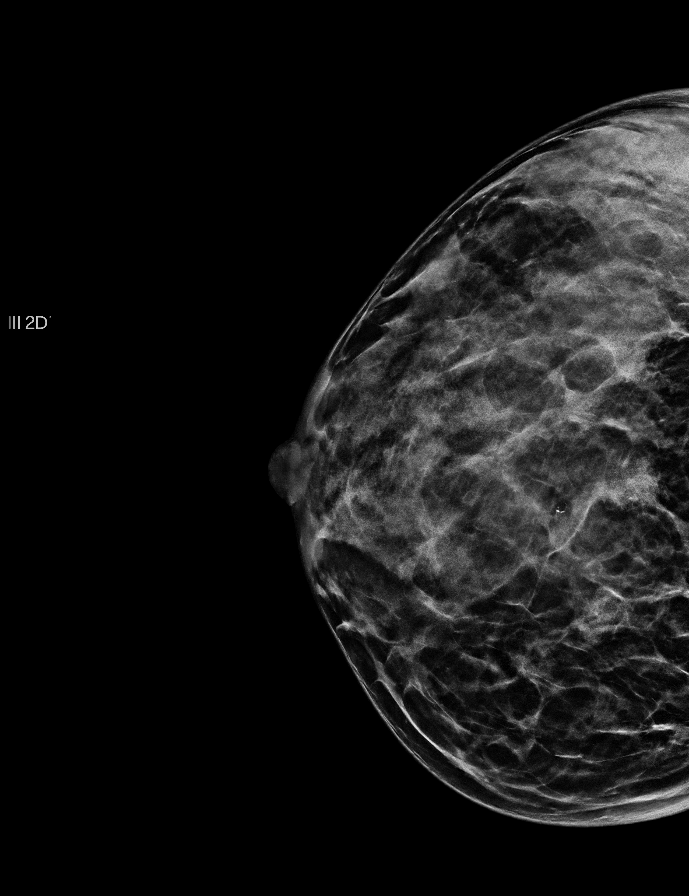

[L CC tomo · 2 of 14 frames shown]
[frame 5/14]
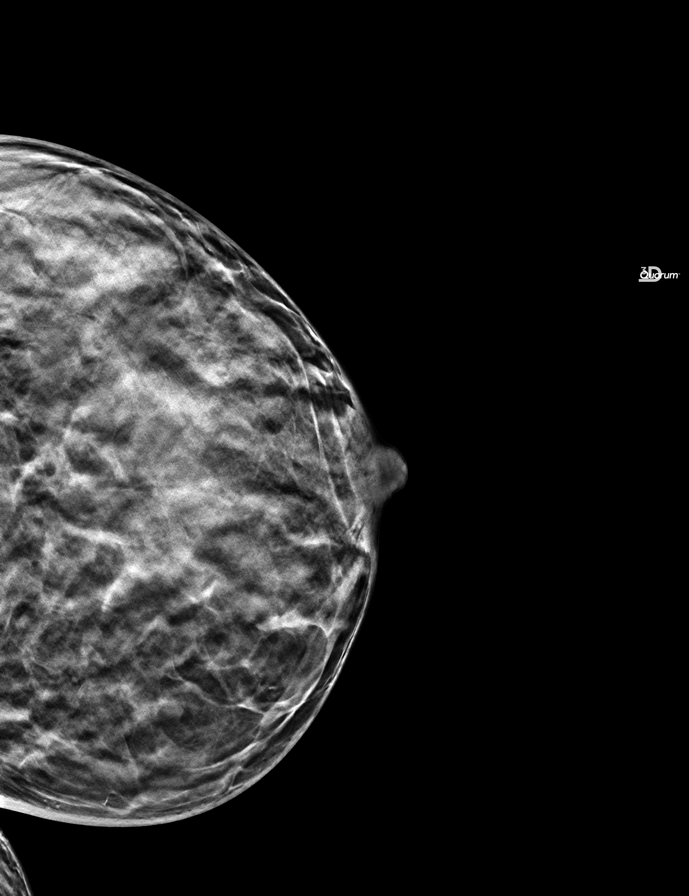
[frame 7/14]
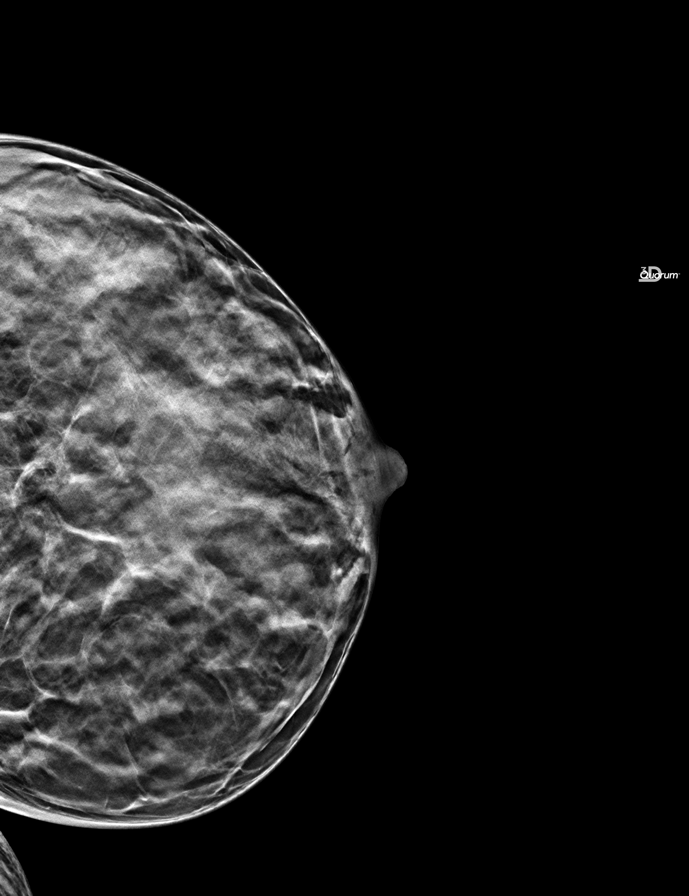

[R MLO tomo · tomo slice 8/15.0]
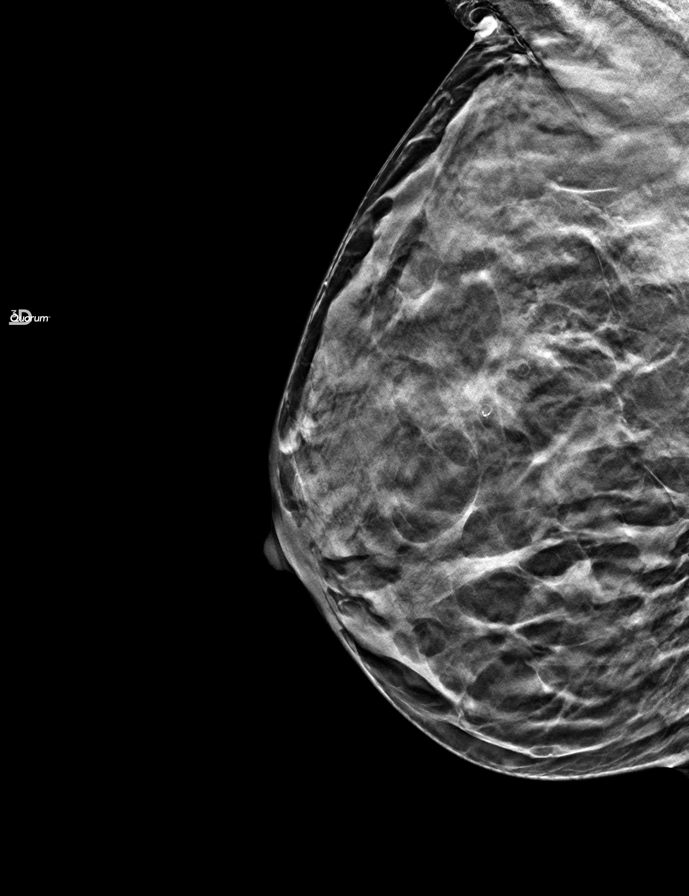

[L MLO tomo · tomo slice 8/15.0]
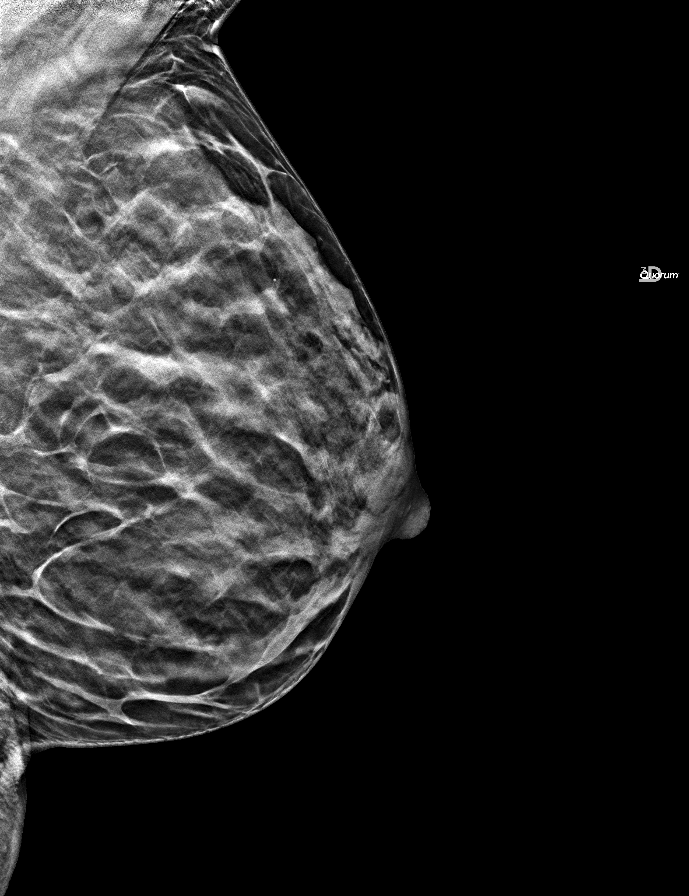

[R CC tomo · tomo slice 7/13.0]
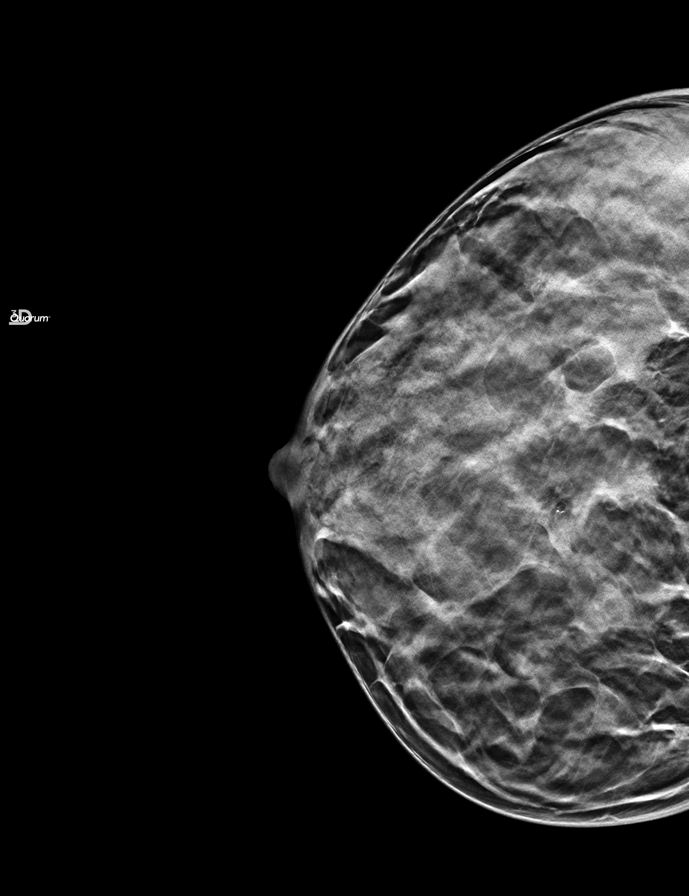

[9 of 24 positions shown; findings below may reference images not displayed]

FINDINGS: No new dominant mass or new suspicious clusters of microcalcifications 
with minimal benign calculations in both breasts. 
 On limited bilateral breast ultrasound at site of pain in the retroareolar 
regions demonstrates no discrete solid or cystic lesion and no shadowing 
abnormality.
IMPRESSION: (BI-RADS 2) Benign findings. Routine mammographic follow-up is recommended.
